# Patient Record
Sex: Female | Born: 1950 | Race: Black or African American | Hispanic: No | Marital: Single | Smoking: Former smoker
Health system: Southern US, Community
[De-identification: ages and names within clinical notes are randomized; demographics above are authoritative.]

## PROBLEM LIST (undated history)

## (undated) DIAGNOSIS — M19042 Primary osteoarthritis, left hand: Secondary | ICD-10-CM

## (undated) DIAGNOSIS — J301 Allergic rhinitis due to pollen: Secondary | ICD-10-CM

## (undated) DIAGNOSIS — F419 Anxiety disorder, unspecified: Secondary | ICD-10-CM

## (undated) DIAGNOSIS — I10 Essential (primary) hypertension: Secondary | ICD-10-CM

## (undated) DIAGNOSIS — G459 Transient cerebral ischemic attack, unspecified: Secondary | ICD-10-CM

## (undated) HISTORY — DX: Transient cerebral ischemic attack, unspecified: G45.9

## (undated) HISTORY — DX: Allergic rhinitis due to pollen: J30.1

## (undated) HISTORY — DX: Essential (primary) hypertension: I10

## (undated) HISTORY — DX: Anxiety disorder, unspecified: F41.9

## (undated) HISTORY — DX: Primary osteoarthritis, left hand: M19.042

---

## 1971-07-02 HISTORY — PX: APPENDECTOMY: SHX54

## 2001-07-22 ENCOUNTER — Emergency Department (HOSPITAL_COMMUNITY): Admission: EM | Admit: 2001-07-22 | Discharge: 2001-07-22 | Payer: Self-pay | Admitting: Emergency Medicine

## 2001-07-22 ENCOUNTER — Encounter: Payer: Self-pay | Admitting: Internal Medicine

## 2010-07-01 DIAGNOSIS — G459 Transient cerebral ischemic attack, unspecified: Secondary | ICD-10-CM

## 2010-07-01 HISTORY — DX: Transient cerebral ischemic attack, unspecified: G45.9

## 2011-06-30 ENCOUNTER — Emergency Department (HOSPITAL_BASED_OUTPATIENT_CLINIC_OR_DEPARTMENT_OTHER)
Admission: EM | Admit: 2011-06-30 | Discharge: 2011-06-30 | Disposition: A | Discharge status: Home / Self Care | Payer: Self-pay | Attending: Emergency Medicine | Admitting: Emergency Medicine

## 2011-06-30 DIAGNOSIS — J3489 Other specified disorders of nose and nasal sinuses: Secondary | ICD-10-CM | POA: Insufficient documentation

## 2011-06-30 DIAGNOSIS — J329 Chronic sinusitis, unspecified: Secondary | ICD-10-CM | POA: Insufficient documentation

## 2011-06-30 MED ORDER — DOXYCYCLINE HYCLATE 100 MG PO CAPS: 100.0000 mg | ORAL_CAPSULE | Freq: Two times a day (BID) | ORAL | Status: AC

## 2011-06-30 NOTE — ED Notes (Signed)
Pt reports she has the flu-exposure from grand child-c/o sinus congestion/fever since 12/25

## 2011-06-30 NOTE — ED Provider Notes (Signed)
History    This chart was scribed for Hilario Quarry, MD, MD by Smitty Pluck. The patient was seen in room MHCT2 and the patient's care was started at 3:21PM.   CSN: [id number]  Arrival date & time 06/30/11  1436   First MD Initiated Contact with Patient 06/30/11 1506      Chief Complaint  Patient presents with  . Influenza    (Consider location/radiation/quality/duration/timing/severity/associated sxs/prior treatment) The history is provided by the patient.   Carrie Ramsey is a 60 y.o. female who presents to the Emergency Department complaining of flu like symptoms onset 5 days ago. Pt denies vomiting, nausea. She reports nasal congestion and productive cough. Pt reports having sinus problems in the past. She reports having sick exposure from grandchild. She reports having fever of 103. Her symptoms have been constant since onset.  History reviewed. No pertinent past medical history.  History reviewed. No pertinent past surgical history.  No family history on file.  History  Substance Use Topics  . Smoking status: Former Games developer  . Smokeless tobacco: Not on file  . Alcohol Use: No    OB History    Grav Para Term Preterm Abortions TAB SAB Ect Mult Living                  Review of Systems  All other systems reviewed and are negative.  10 Systems reviewed and are negative for acute change except as noted in the HPI.   Allergies  Penicillins  Home Medications   Current Outpatient Rx  Name Route Sig Dispense Refill  . MULTIVITAMIN PO Oral Take by mouth.        BP 151/96  Pulse 93  Temp(Src) 98.3 F (36.8 C) (Oral)  Resp 20  Ht 5\' 6"  (1.676 m)  Wt 140 lb (63.504 kg)  BMI 22.60 kg/m2  SpO2 100%  Physical Exam  Nursing note and vitals reviewed. Constitutional: She is oriented to person, place, and time. She appears well-developed and well-nourished. No distress.  HENT:  Head: Normocephalic and atraumatic.  Right Ear: External ear normal.  Left Ear:  External ear normal.  Mouth/Throat: Oropharynx is clear and moist.       Tender in forehead  Eyes: Conjunctivae and EOM are normal. Pupils are equal, round, and reactive to light.  Neck: Normal range of motion. Neck supple.  Cardiovascular: Normal rate, regular rhythm and normal heart sounds.   Pulmonary/Chest: Effort normal and breath sounds normal. No respiratory distress.  Neurological: She is alert and oriented to person, place, and time.  Skin: Skin is warm and dry.  Psychiatric: She has a normal mood and affect. Her behavior is normal.    ED Course  Procedures (including critical care time) DIAGNOSTIC STUDIES: Oxygen Saturation is 100% on room air, normal by my interpretation.    COORDINATION OF CARE:    Labs Reviewed - No data to display No results found.   No diagnosis found.    MDM       I personally performed the services described in this documentation, which was scribed in my presence. The recorded information has been reviewed and considered.     Hilario Quarry, MD 06/30/11 [phone]

## 2011-08-26 ENCOUNTER — Ambulatory Visit (INDEPENDENT_AMBULATORY_CARE_PROVIDER_SITE_OTHER): Payer: Self-pay | Admitting: Family Medicine

## 2011-08-26 ENCOUNTER — Encounter: Payer: Self-pay | Admitting: Family Medicine

## 2011-08-26 VITALS — BP 169/81 | HR 60 | Ht 66.0 in | Wt 139.0 lb

## 2011-08-26 DIAGNOSIS — I10 Essential (primary) hypertension: Secondary | ICD-10-CM

## 2011-08-26 DIAGNOSIS — F329 Major depressive disorder, single episode, unspecified: Secondary | ICD-10-CM

## 2011-08-26 DIAGNOSIS — M19042 Primary osteoarthritis, left hand: Secondary | ICD-10-CM

## 2011-08-26 DIAGNOSIS — F411 Generalized anxiety disorder: Secondary | ICD-10-CM

## 2011-08-26 DIAGNOSIS — M19049 Primary osteoarthritis, unspecified hand: Secondary | ICD-10-CM

## 2011-08-26 DIAGNOSIS — J301 Allergic rhinitis due to pollen: Secondary | ICD-10-CM

## 2011-08-26 DIAGNOSIS — F419 Anxiety disorder, unspecified: Secondary | ICD-10-CM

## 2011-08-26 DIAGNOSIS — J309 Allergic rhinitis, unspecified: Secondary | ICD-10-CM

## 2011-08-26 DIAGNOSIS — R259 Unspecified abnormal involuntary movements: Secondary | ICD-10-CM

## 2011-08-26 DIAGNOSIS — R251 Tremor, unspecified: Secondary | ICD-10-CM

## 2011-08-26 LAB — CBC WITH DIFFERENTIAL/PLATELET
Basophils Absolute: 0 10*3/uL (ref 0.0–0.1)
Basophils Relative: 1 % (ref 0–1)
Eosinophils Absolute: 0.1 10*3/uL (ref 0.0–0.7)
Eosinophils Relative: 2 % (ref 0–5)
HCT: 45 % (ref 36.0–46.0)
Hemoglobin: 14.9 g/dL (ref 12.0–15.0)
Lymphocytes Relative: 30 % (ref 12–46)
Lymphs Abs: 1.4 10*3/uL (ref 0.7–4.0)
MCH: 31.4 pg (ref 26.0–34.0)
MCHC: 33.1 g/dL (ref 30.0–36.0)
MCV: 94.7 fL (ref 78.0–100.0)
Monocytes Absolute: 0.4 10*3/uL (ref 0.1–1.0)
Monocytes Relative: 8 % (ref 3–12)
Neutro Abs: 2.9 10*3/uL (ref 1.7–7.7)
Neutrophils Relative %: 60 % (ref 43–77)
Platelets: 99 10*3/uL — ABNORMAL LOW (ref 150–400)
RBC: 4.75 MIL/uL (ref 3.87–5.11)
RDW: 14.3 % (ref 11.5–15.5)
WBC: 4.8 10*3/uL (ref 4.0–10.5)

## 2011-08-26 LAB — TSH: TSH: 1.897 u[IU]/mL (ref 0.350–4.500)

## 2011-08-26 NOTE — Patient Instructions (Addendum)
Dear Mrs. Novell,   Thank you for coming to clinic today. Please read below regarding the issues that we discussed.   1. Tremor - This could be from multiple origins. We should continue with the beta blocker for this time to see if it improves. We will reassess in 2 weeks.    2. Depression and Anxiety - I will send you home with 2 forms to complete and bring back to the next visit.   Please follow up in clinic in 2 weeks . Please call earlier if you have any questions or concerns.   Sincerely,   Dr. Clinton Sawyer

## 2011-08-27 ENCOUNTER — Encounter: Payer: Self-pay | Admitting: Family Medicine

## 2011-08-27 DIAGNOSIS — M19042 Primary osteoarthritis, left hand: Secondary | ICD-10-CM | POA: Insufficient documentation

## 2011-08-27 DIAGNOSIS — J301 Allergic rhinitis due to pollen: Secondary | ICD-10-CM | POA: Insufficient documentation

## 2011-08-27 DIAGNOSIS — R251 Tremor, unspecified: Secondary | ICD-10-CM | POA: Insufficient documentation

## 2011-08-27 DIAGNOSIS — F419 Anxiety disorder, unspecified: Secondary | ICD-10-CM | POA: Insufficient documentation

## 2011-08-27 MED ORDER — METOPROLOL TARTRATE 25 MG PO TABS: 25.0000 mg | ORAL_TABLET | Freq: Two times a day (BID) | ORAL | Status: DC

## 2011-08-27 MED ORDER — GLUCOSAMINE 500 MG PO CAPS: 500.0000 mg | ORAL_CAPSULE | Freq: Every day | ORAL | Status: DC

## 2011-08-27 MED ORDER — VITAMIN C 500 MG PO CAPS: 500.0000 mg | ORAL_CAPSULE | Freq: Every day | ORAL | Status: AC

## 2011-08-27 MED ORDER — VITAMIN B-12 100 MCG PO TABS: 500.0000 ug | ORAL_TABLET | Freq: Every day | ORAL | Status: AC

## 2011-08-27 NOTE — Assessment & Plan Note (Signed)
This is not the clear origin of her tremor, but is significantly impacting her life.  Carrie Ramsey will complete a GAD questionnaire and weill discuss at the next visit.

## 2011-09-02 DIAGNOSIS — F329 Major depressive disorder, single episode, unspecified: Secondary | ICD-10-CM | POA: Insufficient documentation

## 2011-09-02 DIAGNOSIS — I10 Essential (primary) hypertension: Secondary | ICD-10-CM | POA: Insufficient documentation

## 2011-09-02 NOTE — Progress Notes (Signed)
  Subjective:    Patient ID: Carrie Ramsey, female    DOB: 1951-03-07, 61 y.o.   MRN: [id number]  HPI Mrs. Lal is a new patient who presents with a chief complaint of a tremor.   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; since that time the tremor has been persistent; she states that is will occasionally abate when she is working and not thinking about it, but returns immediately after she realizes that it is not present; also reduces when she is cooking or lying down to go to sleep; only present in her left hand; she has not noticed any other symptoms such as difficulty with walking, speech, weakness, or tremors elsewhere; last week she briefly saw her former PCP who she had not been too in years 2/2 to having lots of financial issues; she was prescribed metoprolol BID by this PCP, which she took for the first time today; this tremor causes significant stress to her and she 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; she is not on any treatment for anxiety or depression, but feels that both are much improved    Review of Systems Positive: left hand tremor, mild depression, situational anxiety, allergic rhinitis, joint pain in left hand Negative: difficulty walking, poor balance, right-sided tremor, difficulty swallowing, difficulty speaking,      Objective:   Physical Exam BP 169/81  Pulse 60  Ht 5\' 6"  (1.676 m)  Wt 139 lb (63.05 kg)  BMI 22.44 kg/m2 Vitals - patient is hypertensive Gen: alert, oriented, distressed appearing Neuro: resting tremor of left thumb at moderate frequency, hand and digits 2-5 flexed at MCP, PIP, DIP joints; mild decrease in tremor with action and postural  movement but difficulty of opening door with left hand; mild rigidity of left upper extremity with passive flexion; no rigidity of RUE;  normal gait; no bradykinesia; equivocal postural instability testing  MSK:5/5 grip strength bilaterally; 5/5 strength of upper extermities Psych: distressed, tearful throughout interview when discussing financial struggles, states that mood much improved from last year     Assessment & Plan:  Mrs. Deol is a new patient who presents with a tremor of unclear etiology as well as anxiety, depression, and hypertesion which all require further monitoring.

## 2011-09-02 NOTE — Assessment & Plan Note (Signed)
Unclear etiology despite patient's claim that is it related to stress. She is likely correct as it does have components of rest, postural and intention. 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  The differential also includes parkinson's disease and vascular-related (pt did state she had facial droop for < 48 hours last year but she did not see a physician).  - Continue metoprolol since this is her first day of the full course and would treat essential tremor, may lower BP, and could decrease situational anxiety; However, metoprolol doesn't have as good of data as propranolol for tremor, so consider switch if indicated  - Further PE testing needed included cerebellar function tests - Consider imaging of head and referral to neurology

## 2011-09-02 NOTE — Assessment & Plan Note (Signed)
This has not been addressed in the past. However, the patient states that it is much improved as she wouldn't get out of bed for days last year.  - Patient will complete PHQ-9 and will be addressed at next visit

## 2011-09-16 ENCOUNTER — Encounter: Payer: Self-pay | Admitting: Family Medicine

## 2011-09-16 ENCOUNTER — Ambulatory Visit (INDEPENDENT_AMBULATORY_CARE_PROVIDER_SITE_OTHER): Payer: Self-pay | Admitting: Family Medicine

## 2011-09-16 VITALS — BP 138/84 | HR 78 | Temp 98.7°F | Ht 66.0 in | Wt 144.5 lb

## 2011-09-16 DIAGNOSIS — F411 Generalized anxiety disorder: Secondary | ICD-10-CM

## 2011-09-16 DIAGNOSIS — F419 Anxiety disorder, unspecified: Secondary | ICD-10-CM

## 2011-09-16 DIAGNOSIS — R251 Tremor, unspecified: Secondary | ICD-10-CM

## 2011-09-16 DIAGNOSIS — R259 Unspecified abnormal involuntary movements: Secondary | ICD-10-CM

## 2011-09-16 DIAGNOSIS — I10 Essential (primary) hypertension: Secondary | ICD-10-CM

## 2011-09-16 DIAGNOSIS — F329 Major depressive disorder, single episode, unspecified: Secondary | ICD-10-CM

## 2011-09-16 NOTE — Progress Notes (Signed)
  Subjective:    Patient ID: Carrie Ramsey, female    DOB: August 01, 1950, 61 y.o.   MRN: [id number]  HPI Patient here for follow-up of tremor and anxiety.   Left Hand Tremor - Has been present intermittently since last visit. It comes at rest and is in her left hand. She has not noticed any improvement with taking metoprolol, but has still been taking the metoprolol daily because it reduces her headache.  She still believes that it is most heavily correlated to emotion. At time she awake with shaking. She denies difficulty writing (altought she write with her right hand) or walking, but has little use of the left hand.   Anxiety - Not improved, still focused on financial problems more than health issues, does not like having to be referred for tests of other physicians b/c this causes her more stress financially which worsens her health; causes poor sleeping and exacerbates her tremor; She did complete GAD and PHQ questionnaires and dropped them off with office staff, but these have been misplaced; patient is amenable to pharmacotherapy today to help reduce the impact of stress on her daily life; She notes being optimistic about financial future   Review of Systems Positive: for headache (daily, occipital), anxiety regarding financial situation, dizziness Negative: CP, SOB, syncope, suicidal ideation    Objective:   Physical Exam Gen: talkative,  Mildly distressed, masked facies Neuro: resting tremor of of left thumb and left foot while sitting on the exam table; present with action, mildly reduced with holding LUE static; significant rigidity of LUE vs. 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 Hussey is a 61 year old female with uncontrolled anxiety that could be exacerbating a tremor that has many Parkinsonian features. Therefore, she would benefit from further evaluation by a neurologist.

## 2011-09-17 MED ORDER — SERTRALINE HCL 50 MG PO TABS: 50.0000 mg | ORAL_TABLET | Freq: Every day | ORAL | Status: AC

## 2011-09-30 ENCOUNTER — Telehealth: Payer: Self-pay | Admitting: *Deleted

## 2011-09-30 NOTE — Telephone Encounter (Signed)
Message left on our office voicemail to call back to [facility] (Diane ---ext. [phone]).  Returned call and left message to call us back.  Gaylene Brooks, RN

## 2011-10-01 NOTE — Telephone Encounter (Signed)
Forward message to red team for referral to neurology clinic at [facility].  Gaylene Brooks, RN

## 2011-10-01 NOTE — Telephone Encounter (Signed)
Message left on our office voicemail from Diane at [facility].  "Patient does not have any insurance or money" and patient stated she will call back to schedule an appt later.  FYI---will need new referral if patient calls back after 3 months.   Will route to Dr. Clinton Sawyer.   Gaylene Brooks, RN

## 2011-10-01 NOTE — Telephone Encounter (Signed)
The patient stated that she would like to be seen in the resident neurology clinic at [facility] if possible. Se believes that this will be more affordable. Have you tried to refer her there?  Thank you for all of your help.

## 2011-10-06 NOTE — Assessment & Plan Note (Signed)
This tremor has many characteristic features of Parkinsonism including resting, unilateral, in foot as well, rigidity, bradykinesia and masked facies. I would like for her to be evaluated by a neurologist. She would prefer to be seen in the resident neurology clinic at [facility], because she believes that it will be less expensive or that she may get some financial assistance. Otherwise, if she refuses or cannot be seen by a neurologist, then I will consider starting a first line medication for parkinsonism and obtaining further imaging.

## 2011-10-06 NOTE — Assessment & Plan Note (Signed)
GAD questionnaire never found. Regardless, patient will start SSRI. Reassess in 6-8 weeks.

## 2011-10-06 NOTE — Assessment & Plan Note (Signed)
PHQ-9 not found. Regardless, the patient notes major depression and anxiety that would probably benefit from SSRI treatment. Start Sertraline at 50 mg daily. Reassess in 6-8 weeks and titrate thereafter.

## 2011-10-06 NOTE — Assessment & Plan Note (Signed)
BP Readings from Last 3 Encounters:  09/16/11 138/84  08/26/11 169/81  06/30/11 151/96    BP greatly improved on Metoprolol BID. Patient has also noticed decrease in frequency of headache, which could have been related to HTN. Continue Metoprolol.

## 2011-10-23 ENCOUNTER — Ambulatory Visit: Payer: Self-pay | Admitting: Family Medicine

## 2011-10-29 NOTE — Telephone Encounter (Signed)
Per note in referral scheduling by Gaylyn Lambert, CMA---Pt will be seen at [facility] in their resident teaching program. Pt advised they will send a NP packet with all info on where to go, etc. Pt agreed to setting up a payment plan w/them.  Appt on 12/05/11 @ 1:45pm.  Gaylene Brooks, RN

## 2012-08-10 ENCOUNTER — Encounter: Payer: Self-pay | Admitting: Internal Medicine

## 2014-11-11 ENCOUNTER — Emergency Department (HOSPITAL_COMMUNITY)
Admission: EM | Admit: 2014-11-11 | Discharge: 2014-11-11 | Disposition: A | Discharge status: Home / Self Care | Payer: 59 | Attending: Emergency Medicine | Admitting: Emergency Medicine

## 2014-11-11 ENCOUNTER — Encounter (HOSPITAL_COMMUNITY): Payer: Self-pay | Admitting: Emergency Medicine

## 2014-11-11 DIAGNOSIS — Z880 Allergy status to penicillin: Secondary | ICD-10-CM | POA: Insufficient documentation

## 2014-11-11 DIAGNOSIS — Z87891 Personal history of nicotine dependence: Secondary | ICD-10-CM | POA: Insufficient documentation

## 2014-11-11 DIAGNOSIS — Z8659 Personal history of other mental and behavioral disorders: Secondary | ICD-10-CM | POA: Insufficient documentation

## 2014-11-11 DIAGNOSIS — Z8709 Personal history of other diseases of the respiratory system: Secondary | ICD-10-CM | POA: Diagnosis not present

## 2014-11-11 DIAGNOSIS — M1990 Unspecified osteoarthritis, unspecified site: Secondary | ICD-10-CM | POA: Insufficient documentation

## 2014-11-11 DIAGNOSIS — Z79899 Other long term (current) drug therapy: Secondary | ICD-10-CM | POA: Insufficient documentation

## 2014-11-11 DIAGNOSIS — Z8673 Personal history of transient ischemic attack (TIA), and cerebral infarction without residual deficits: Secondary | ICD-10-CM | POA: Diagnosis not present

## 2014-11-11 DIAGNOSIS — R51 Headache: Secondary | ICD-10-CM | POA: Diagnosis not present

## 2014-11-11 DIAGNOSIS — I10 Essential (primary) hypertension: Secondary | ICD-10-CM | POA: Insufficient documentation

## 2014-11-11 DIAGNOSIS — G20 Parkinson's disease: Secondary | ICD-10-CM | POA: Insufficient documentation

## 2014-11-11 DIAGNOSIS — R519 Headache, unspecified: Secondary | ICD-10-CM

## 2014-11-11 MED ORDER — IBUPROFEN 200 MG PO TABS
600.0000 mg | ORAL_TABLET | Freq: Once | ORAL | Status: AC
Start: 2014-11-11 — End: 2014-11-11
  Administered 2014-11-11: 600 mg via ORAL
  Filled 2014-11-11: qty 3

## 2014-11-11 MED ORDER — IBUPROFEN 600 MG PO TABS: 600.0000 mg | ORAL_TABLET | Freq: Four times a day (QID) | ORAL | Status: DC | PRN

## 2014-11-11 NOTE — ED Provider Notes (Signed)
CSN: [id number]     Arrival date & time 11/11/14  1302 History   First MD Initiated Contact with Patient 11/11/14 1505     Chief Complaint  Patient presents with  . Head Pain      (Consider location/radiation/quality/duration/timing/severity/associated sxs/prior Treatment) HPI Comments: Pt comes in with cc of headache. Pt has hx of early parkinsons, HTN, HL. Reports that she started having pain to the right parieto-occiptal region, very focal starting yday. Pain is throbbing and burning type. Patient has NO HISTORY OF MIGRAINES, and has no hx of headaches, but she reports similar headache a year ago, she went to [facility] and received tylenol. Pt denies any n/v/f/c/neck pain/stiffness/numbness/tingling/vision complains/gait issues. She has no hx of strokes. Pt reports taking tylenol last night, and got some relief and was able to sleep. She woke up with mild pain, which has gotten worse again. Pain is not the worst pain in her life. Pain is not positional.    ROS 10 Systems reviewed and are negative for acute change except as noted in the HPI.     The history is provided by the patient.    Past Medical History  Diagnosis Date  . TIA (transient ischemic attack) 2012    Patient reported, did not seek physician help  . Hay fever   . Arthritis of left hand   . Anxiety   . Hypertension    Past Surgical History  Procedure Laterality Date  . Appendectomy  1973   Family History  Problem Relation Age of Onset  . Aneurysm Brother   . Diabetes Mother    History  Substance Use Topics  . Smoking status: Former Research scientist (life sciences)  . Smokeless tobacco: Not on file  . Alcohol Use: No   OB History    No data available     Review of Systems  Neurological: Positive for headaches. Negative for dizziness, syncope, facial asymmetry, speech difficulty, weakness and numbness.  All other systems reviewed and are negative.     Allergies  Penicillins and Sulfa antibiotics  Home Medications    Prior to Admission medications   Medication Sig Start Date End Date Taking? Authorizing Provider  Ascorbic Acid (VITAMIN C) 500 MG CAPS Take 500 mg by mouth daily. 08/27/11  Yes Angelica Ran, MD  Carbidopa-Levodopa ER (SINEMET CR) 25-100 MG tablet controlled release Take 1.5 tablets by mouth 3 (three) times daily.  10/03/14  Yes Historical Provider, MD  Multiple Vitamins-Minerals (MULTIVITAMIN PO) Take 1 tablet by mouth daily.    Yes Historical Provider, MD  omega-3 acid ethyl esters (LOVAZA) 1 G capsule Take by mouth 2 (two) times daily.   Yes Historical Provider, MD  Glucosamine 500 MG CAPS Take 1 capsule (500 mg total) by mouth daily. Patient not taking: Reported on 11/11/2014 08/27/11   Angelica Ran, MD  ibuprofen (ADVIL,MOTRIN) 600 MG tablet Take 1 tablet (600 mg total) by mouth every 6 (six) hours as needed. 11/11/14   Varney Biles, MD  ibuprofen (ADVIL,MOTRIN) 600 MG tablet Take 1 tablet (600 mg total) by mouth every 6 (six) hours as needed. 11/11/14   Varney Biles, MD  metoprolol tartrate (LOPRESSOR) 25 MG tablet Take 1 tablet (25 mg total) by mouth 2 (two) times daily. Patient not taking: Reported on 11/11/2014 08/27/11 08/26/12  Angelica Ran, MD   BP 169/92 mmHg  Pulse 72  Temp(Src) 97.8 F (36.6 C) (Oral)  Resp 18  SpO2 99% Physical Exam  Constitutional: She is oriented to person, place,  and time. She appears well-developed and well-nourished.  HENT:  Head: Normocephalic and atraumatic.  Right Ear: External ear normal.  Left Ear: External ear normal.  No mastoid tenderness  Eyes: EOM are normal. Pupils are equal, round, and reactive to light.  Neck: Neck supple.  No nuchal rigidity  Cardiovascular: Normal rate, regular rhythm and normal heart sounds.   No murmur heard. Pulmonary/Chest: Effort normal. No respiratory distress.  Abdominal: Soft. She exhibits no distension. There is no tenderness. There is no rebound and no guarding.  Neurological: She is  alert and oriented to person, place, and time. No cranial nerve deficit. Coordination normal.  Skin: Skin is warm and dry.  Nursing note and vitals reviewed.   ED Course  Procedures (including critical care time) Labs Review Labs Reviewed - No data to display  Imaging Review No results found.   EKG Interpretation None      MDM   Final diagnoses:  Acute intractable headache, unspecified headache type    Pt comes in with cc of headaches.  DDX includes: Primary headaches - including migrainous headaches, cluster headaches, tension headaches. ICH Carotid dissection Cavernous sinus thrombosis Meningitis Sinusitis Tumor Vascular headaches AV malformation Brain aneurysm Muscular headaches  A/P: Pt comes in with cc of headaches. No concerns for life threatening secondary headaches because  Pt has no migraine hx. No n/v/f/c, no meningeal signs. Pain is very focal - one specific spot in the parieto-temporal region. No trauma, no neuro complains or neuro deficits.  Discussed case with the patient and the son. We agreed to treat the pain conservatively for now. She is to see her pcp if the pain persists. I cant think of the specific cause for the headache (? Tension type headache), but there appears no evidence of brain bleed or infection.    Varney Biles, MD 11/11/14 [phone]

## 2014-11-11 NOTE — Discharge Instructions (Signed)
We saw you in the ER for headaches. We are not sure what is causing your headaches, however, there appears to be no evidence of infection, bleeds or tumors based on our exam and results.  Please take motrin round the clock for the next 6 hours, and take other meds prescribed only for break through pain. See your doctor if the pain persists, as you might need better medications or a specialist.   Headaches, Frequently Asked Questions MIGRAINE HEADACHES Q: What is migraine? What causes it? How can I treat it? A: Generally, migraine headaches begin as a dull ache. Then they develop into a constant, throbbing, and pulsating pain. You may experience pain at the temples. You may experience pain at the front or back of one or both sides of the head. The pain is usually accompanied by a combination of:  Nausea.  Vomiting.  Sensitivity to light and noise. Some people (about 15%) experience an aura (see below) before an attack. The cause of migraine is believed to be chemical reactions in the brain. Treatment for migraine may include over-the-counter or prescription medications. It may also include self-help techniques. These include relaxation training and biofeedback.  Q: What is an aura? A: About 15% of people with migraine get an "aura". This is a sign of neurological symptoms that occur before a migraine headache. You may see wavy or jagged lines, dots, or flashing lights. You might experience tunnel vision or blind spots in one or both eyes. The aura can include visual or auditory hallucinations (something imagined). It may include disruptions in smell (such as strange odors), taste or touch. Other symptoms include:  Numbness.  A "pins and needles" sensation.  Difficulty in recalling or speaking the correct word. These neurological events may last as long as 60 minutes. These symptoms will fade as the headache begins. Q: What is a trigger? A: Certain physical or environmental factors can lead  to or "trigger" a migraine. These include:  Foods.  Hormonal changes.  Weather.  Stress. It is important to remember that triggers are different for everyone. To help prevent migraine attacks, you need to figure out which triggers affect you. Keep a headache diary. This is a good way to track triggers. The diary will help you talk to your healthcare professional about your condition. Q: Does weather affect migraines? A: Bright sunshine, hot, humid conditions, and drastic changes in barometric pressure may lead to, or "trigger," a migraine attack in some people. But studies have shown that weather does not act as a trigger for everyone with migraines. Q: What is the link between migraine and hormones? A: Hormones start and regulate many of your body's functions. Hormones keep your body in balance within a constantly changing environment. The levels of hormones in your body are unbalanced at times. Examples are during menstruation, pregnancy, or menopause. That can lead to a migraine attack. In fact, about three quarters of all women with migraine report that their attacks are related to the menstrual cycle.  Q: Is there an increased risk of stroke for migraine sufferers? A: The likelihood of a migraine attack causing a stroke is very remote. That is not to say that migraine sufferers cannot have a stroke associated with their migraines. In persons under age 101, the most common associated factor for stroke is migraine headache. But over the course of a person's normal life span, the occurrence of migraine headache may actually be associated with a reduced risk of dying from cerebrovascular disease due to  stroke.  Q: What are acute medications for migraine? A: Acute medications are used to treat the pain of the headache after it has started. Examples over-the-counter medications, NSAIDs, ergots, and triptans.  Q: What are the triptans? A: Triptans are the newest class of abortive medications. They  are specifically targeted to treat migraine. Triptans are vasoconstrictors. They moderate some chemical reactions in the brain. The triptans work on receptors in your brain. Triptans help to restore the balance of a neurotransmitter called serotonin. Fluctuations in levels of serotonin are thought to be a main cause of migraine.  Q: Are over-the-counter medications for migraine effective? A: Over-the-counter, or "OTC," medications may be effective in relieving mild to moderate pain and associated symptoms of migraine. But you should see your caregiver before beginning any treatment regimen for migraine.  Q: What are preventive medications for migraine? A: Preventive medications for migraine are sometimes referred to as "prophylactic" treatments. They are used to reduce the frequency, severity, and length of migraine attacks. Examples of preventive medications include antiepileptic medications, antidepressants, beta-blockers, calcium channel blockers, and NSAIDs (nonsteroidal anti-inflammatory drugs). Q: Why are anticonvulsants used to treat migraine? A: During the past few years, there has been an increased interest in antiepileptic drugs for the prevention of migraine. They are sometimes referred to as "anticonvulsants". Both epilepsy and migraine may be caused by similar reactions in the brain.  Q: Why are antidepressants used to treat migraine? A: Antidepressants are typically used to treat people with depression. They may reduce migraine frequency by regulating chemical levels, such as serotonin, in the brain.  Q: What alternative therapies are used to treat migraine? A: The term "alternative therapies" is often used to describe treatments considered outside the scope of conventional Western medicine. Examples of alternative therapy include acupuncture, acupressure, and yoga. Another common alternative treatment is herbal therapy. Some herbs are believed to relieve headache pain. Always discuss  alternative therapies with your caregiver before proceeding. Some herbal products contain arsenic and other toxins. TENSION HEADACHES Q: What is a tension-type headache? What causes it? How can I treat it? A: Tension-type headaches occur randomly. They are often the result of temporary stress, anxiety, fatigue, or anger. Symptoms include soreness in your temples, a tightening band-like sensation around your head (a "vice-like" ache). Symptoms can also include a pulling feeling, pressure sensations, and contracting head and neck muscles. The headache begins in your forehead, temples, or the back of your head and neck. Treatment for tension-type headache may include over-the-counter or prescription medications. Treatment may also include self-help techniques such as relaxation training and biofeedback. CLUSTER HEADACHES Q: What is a cluster headache? What causes it? How can I treat it? A: Cluster headache gets its name because the attacks come in groups. The pain arrives with little, if any, warning. It is usually on one side of the head. A tearing or bloodshot eye and a runny nose on the same side of the headache may also accompany the pain. Cluster headaches are believed to be caused by chemical reactions in the brain. They have been described as the most severe and intense of any headache type. Treatment for cluster headache includes prescription medication and oxygen. SINUS HEADACHES Q: What is a sinus headache? What causes it? How can I treat it? A: When a cavity in the bones of the face and skull (a sinus) becomes inflamed, the inflammation will cause localized pain. This condition is usually the result of an allergic reaction, a tumor, or an infection. If your  headache is caused by a sinus blockage, such as an infection, you will probably have a fever. An x-ray will confirm a sinus blockage. Your caregiver's treatment might include antibiotics for the infection, as well as antihistamines or  decongestants.  REBOUND HEADACHES Q: What is a rebound headache? What causes it? How can I treat it? A: A pattern of taking acute headache medications too often can lead to a condition known as "rebound headache." A pattern of taking too much headache medication includes taking it more than 2 days per week or in excessive amounts. That means more than the label or a caregiver advises. With rebound headaches, your medications not only stop relieving pain, they actually begin to cause headaches. Doctors treat rebound headache by tapering the medication that is being overused. Sometimes your caregiver will gradually substitute a different type of treatment or medication. Stopping may be a challenge. Regularly overusing a medication increases the potential for serious side effects. Consult a caregiver if you regularly use headache medications more than 2 days per week or more than the label advises. ADDITIONAL QUESTIONS AND ANSWERS Q: What is biofeedback? A: Biofeedback is a self-help treatment. Biofeedback uses special equipment to monitor your body's involuntary physical responses. Biofeedback monitors:  Breathing.  Pulse.  Heart rate.  Temperature.  Muscle tension.  Brain activity. Biofeedback helps you refine and perfect your relaxation exercises. You learn to control the physical responses that are related to stress. Once the technique has been mastered, you do not need the equipment any more. Q: Are headaches hereditary? A: Four out of five (80%) of people that suffer report a family history of migraine. Scientists are not sure if this is genetic or a family predisposition. Despite the uncertainty, a child has a 50% chance of having migraine if one parent suffers. The child has a 75% chance if both parents suffer.  Q: Can children get headaches? A: By the time they reach high school, most young people have experienced some type of headache. Many safe and effective approaches or medications  can prevent a headache from occurring or stop it after it has begun.  Q: What type of doctor should I see to diagnose and treat my headache? A: Start with your primary caregiver. Discuss his or her experience and approach to headaches. Discuss methods of classification, diagnosis, and treatment. Your caregiver may decide to recommend you to a headache specialist, depending upon your symptoms or other physical conditions. Having diabetes, allergies, etc., may require a more comprehensive and inclusive approach to your headache. The [employer] will provide, upon request, a list of [facility] physician members in your state. Document Released: 09/07/2003 Document Revised: 09/09/2011 Document Reviewed: 02/15/2008 [facility] Patient Information 2015 [place], [place]. This information is not intended to replace advice given to you by your health care provider. Make sure you discuss any questions you have with your health care provider. Tension Headache A tension headache is a feeling of pain, pressure, or aching often felt over the front and sides of the head. The pain can be dull or can feel tight (constricting). It is the most common type of headache. Tension headaches are not normally associated with nausea or vomiting and do not get worse with physical activity. Tension headaches can last 30 minutes to several days.  CAUSES  The exact cause is not known, but it may be caused by chemicals and hormones in the brain that lead to pain. Tension headaches often begin after stress, anxiety, or depression. Other triggers  may include: Alcohol. Caffeine (too much or withdrawal). Respiratory infections (colds, flu, sinus infections). Dental problems or teeth clenching. Fatigue. Holding your head and neck in one position too long while using a computer. SYMPTOMS  Pressure around the head.  Dull, aching head pain.  Pain felt over the front and sides of the head.  Tenderness in the muscles of the head,  neck, and shoulders. DIAGNOSIS  A tension headache is often diagnosed based on:  Symptoms.  Physical examination.  A CT scan or MRI of your head. These tests may be ordered if symptoms are severe or unusual. TREATMENT  Medicines may be given to help relieve symptoms.  HOME CARE INSTRUCTIONS  Only take over-the-counter or prescription medicines for pain or discomfort as directed by your caregiver.  Lie down in a dark, quiet room when you have a headache.  Keep a journal to find out what may be triggering your headaches. For example, write down: What you eat and drink. How much sleep you get. Any change to your diet or medicines. Try massage or other relaxation techniques.  Ice packs or heat applied to the head and neck can be used. Use these 3 to 4 times per day for 15 to 20 minutes each time, or as needed.  Limit stress.  Sit up straight, and do not tense your muscles.  Quit smoking if you smoke. Limit alcohol use. Decrease the amount of caffeine you drink, or stop drinking caffeine. Eat and exercise regularly. Get 7 to 9 hours of sleep, or as recommended by your caregiver. Avoid excessive use of pain medicine as recurrent headaches can occur.  SEEK MEDICAL CARE IF:  You have problems with the medicines you were prescribed. Your medicines do not work. You have a change from the usual headache. You have nausea or vomiting. SEEK IMMEDIATE MEDICAL CARE IF:  Your headache becomes severe. You have a fever. You have a stiff neck. You have loss of vision. You have muscular weakness or loss of muscle control. You lose your balance or have trouble walking. You feel faint or pass out. You have severe symptoms that are different from your first symptoms. MAKE SURE YOU:  Understand these instructions. Will watch your condition. Will get help right away if you are not doing well or get worse. Document Released: 06/17/2005 Document Revised: 09/09/2011 Document Reviewed:  06/07/2011 [facility] Patient Information 2015 [place], [place]. This information is not intended to replace advice given to you by your health care provider. Make sure you discuss any questions you have with your health care provider.

## 2014-11-11 NOTE — ED Notes (Addendum)
Patient reports migraine starting yesterday. Has been taking extra strength tylenol with no alleviation of pain. Denies blurry vision. Endorses light sensitivity. Denies unilateral numbness/weakness. No other s/s. Not on blood thinners. Denies hx migraines.

## 2014-11-11 NOTE — ED Notes (Signed)
Pt reports tenderness to back of her head, denies pain anywhere else, area is sensitive to touch and if she tries to lay down and puts pressure on area pain is increased, no redness noted to area, denies injury or hitting head

## 2015-10-28 ENCOUNTER — Encounter (HOSPITAL_COMMUNITY): Payer: Self-pay

## 2015-10-28 ENCOUNTER — Emergency Department (HOSPITAL_COMMUNITY)
Admission: EM | Admit: 2015-10-28 | Discharge: 2015-10-28 | Disposition: A | Discharge status: Home / Self Care | Payer: Medicare Other | Attending: Emergency Medicine | Admitting: Emergency Medicine

## 2015-10-28 ENCOUNTER — Emergency Department (HOSPITAL_COMMUNITY): Payer: Medicare Other

## 2015-10-28 DIAGNOSIS — I10 Essential (primary) hypertension: Secondary | ICD-10-CM | POA: Diagnosis not present

## 2015-10-28 DIAGNOSIS — Z8673 Personal history of transient ischemic attack (TIA), and cerebral infarction without residual deficits: Secondary | ICD-10-CM | POA: Diagnosis not present

## 2015-10-28 DIAGNOSIS — Z79899 Other long term (current) drug therapy: Secondary | ICD-10-CM | POA: Insufficient documentation

## 2015-10-28 DIAGNOSIS — Z87891 Personal history of nicotine dependence: Secondary | ICD-10-CM | POA: Diagnosis not present

## 2015-10-28 DIAGNOSIS — R51 Headache: Secondary | ICD-10-CM | POA: Diagnosis present

## 2015-10-28 DIAGNOSIS — Z791 Long term (current) use of non-steroidal anti-inflammatories (NSAID): Secondary | ICD-10-CM | POA: Insufficient documentation

## 2015-10-28 DIAGNOSIS — M19042 Primary osteoarthritis, left hand: Secondary | ICD-10-CM | POA: Insufficient documentation

## 2015-10-28 DIAGNOSIS — Z7982 Long term (current) use of aspirin: Secondary | ICD-10-CM | POA: Insufficient documentation

## 2015-10-28 DIAGNOSIS — R519 Headache, unspecified: Secondary | ICD-10-CM

## 2015-10-28 MED ORDER — DIAZEPAM 5 MG/ML IJ SOLN
5.0000 mg | Freq: Once | INTRAMUSCULAR | Status: AC
  Administered 2015-10-28: 5 mg via INTRAVENOUS
  Filled 2015-10-28: qty 2

## 2015-10-28 MED ORDER — HYDROMORPHONE HCL 1 MG/ML IJ SOLN
0.5000 mg | Freq: Once | INTRAMUSCULAR | Status: AC
  Administered 2015-10-28: 0.5 mg via INTRAVENOUS
  Filled 2015-10-28: qty 1

## 2015-10-28 MED ORDER — METHOCARBAMOL 500 MG PO TABS: 500.0000 mg | ORAL_TABLET | Freq: Two times a day (BID) | ORAL | Status: AC

## 2015-10-28 MED ORDER — METOCLOPRAMIDE HCL 5 MG/ML IJ SOLN
10.0000 mg | Freq: Once | INTRAMUSCULAR | Status: AC
  Administered 2015-10-28: 10 mg via INTRAVENOUS
  Filled 2015-10-28: qty 2

## 2015-10-28 MED ORDER — SODIUM CHLORIDE 0.9 % IV BOLUS (SEPSIS)
1000.0000 mL | Freq: Once | INTRAVENOUS | Status: AC
  Administered 2015-10-28: 1000 mL via INTRAVENOUS

## 2015-10-28 MED ORDER — KETOROLAC TROMETHAMINE 30 MG/ML IJ SOLN
30.0000 mg | Freq: Once | INTRAMUSCULAR | Status: AC
  Administered 2015-10-28: 30 mg via INTRAVENOUS
  Filled 2015-10-28: qty 1

## 2015-10-28 MED ORDER — DIPHENHYDRAMINE HCL 50 MG/ML IJ SOLN
25.0000 mg | Freq: Once | INTRAMUSCULAR | Status: AC
  Administered 2015-10-28: 25 mg via INTRAVENOUS
  Filled 2015-10-28: qty 1

## 2015-10-28 NOTE — ED Notes (Signed)
She c/o occipital area h/a x 1 week.  She states at times, she can feel a "bump" there.  She has been seen for this in the past without a firm diagnosis.  She denies treauma, fever, nor any other sign of current illness.  She is tearful as if in much pain.

## 2015-10-28 NOTE — ED Notes (Signed)
Pt and family reports understanding of discharge information. No questions at time of discharge

## 2015-10-28 NOTE — ED Provider Notes (Signed)
  Face-to-face evaluation   History: She complains of headache for one week. No known trauma. No fever. No improvement with over-the-counter medications.  Physical exam: Alert, elderly patient with mild intermittent tremor. She is lucid. Neck is tender bilateral paravertebral musculature and the posterior scalp in the occiput area, is tender. No meningismus. She is alert, oriented, cooperative and follows commands.  Initial clinical impression- likely musculoskeletal pain, query degenerative joint disease, cervical.  Medical screening examination/treatment/procedure(s) were conducted as a shared visit with non-physician practitioner(s) and myself.  I personally evaluated the patient during the encounter  Daleen Bo, MD 10/30/15 [id number]

## 2015-10-28 NOTE — ED Provider Notes (Signed)
CSN: [id number]     Arrival date & time 10/28/15  1650 History   First MD Initiated Contact with Patient 10/28/15 1821     Chief Complaint  Patient presents with  . Headache   HPI Comments: 65 year old female presents with acute onset of a headache for the past 5 days. He has had a history of similar headaches in the past which have always been treated conservatively and have resolved within a couple days. She first noticed her headache when she woke up 5 days ago, it is the worst she's ever had and has lasted the longest. Reports associated neck soreness. Past medical history is significant for Parkinson disease, liver cancer, dizziness upon standing. She is currently receiving chemotherapy. Denies fever, loss of consciousness, trauma, blurry vision, nausea, vomiting, confusion. She sees Neurology at [facility].   Patient is a 65 y.o. female presenting with headaches.  Headache Associated symptoms: dizziness and neck stiffness   Associated symptoms: no fever, no photophobia and no weakness     Past Medical History  Diagnosis Date  . TIA (transient ischemic attack) 2012    Patient reported, did not seek physician help  . Hay fever   . Arthritis of left hand   . Anxiety   . Hypertension    Past Surgical History  Procedure Laterality Date  . Appendectomy  1973   Family History  Problem Relation Age of Onset  . Aneurysm Brother   . Diabetes Mother    Social History  Substance Use Topics  . Smoking status: Former Research scientist (life sciences)  . Smokeless tobacco: None  . Alcohol Use: No   OB History    No data available     Review of Systems  Constitutional: Negative for fever.  Eyes: Negative for photophobia and visual disturbance.  Musculoskeletal: Positive for neck stiffness.  Neurological: Positive for dizziness and headaches. Negative for syncope and weakness.  All other systems reviewed and are negative.     Allergies  Penicillins and Sulfa antibiotics  Home Medications   Prior to  Admission medications   Medication Sig Start Date End Date Taking? Authorizing Provider  amantadine (SYMMETREL) 100 MG capsule Take 100 mg by mouth 2 (two) times daily as needed. pain 09/01/15  Yes Historical Provider, MD  Ascorbic Acid (VITAMIN C) 500 MG CAPS Take 500 mg by mouth daily. 08/27/11  Yes Angelica Ran, MD  aspirin EC 81 MG tablet Take 81 mg by mouth daily.   Yes Historical Provider, MD  Carbidopa-Levodopa ER (SINEMET CR) 25-100 MG tablet controlled release Take 1.5 tablets by mouth 3 (three) times daily.  10/03/14  Yes Historical Provider, MD  Cholecalciferol (VITAMIN D-1000 MAX ST) 1000 units tablet Take 1,000 Units by mouth daily.   Yes Historical Provider, MD  ibuprofen (ADVIL,MOTRIN) 200 MG tablet Take 400 mg by mouth every 6 (six) hours as needed for moderate pain.   Yes Historical Provider, MD  Multiple Vitamins-Minerals (MULTIVITAMIN PO) Take 1 tablet by mouth 4 (four) times a week.    Yes Historical Provider, MD  omega-3 acid ethyl esters (LOVAZA) 1 G capsule Take 1 g by mouth daily.    Yes Historical Provider, MD  Glucosamine 500 MG CAPS Take 1 capsule (500 mg total) by mouth daily. Patient not taking: Reported on 11/11/2014 08/27/11   Angelica Ran, MD  ibuprofen (ADVIL,MOTRIN) 600 MG tablet Take 1 tablet (600 mg total) by mouth every 6 (six) hours as needed. Patient not taking: Reported on 10/28/2015 11/11/14   Ankit  Kathrynn Humble, MD  ibuprofen (ADVIL,MOTRIN) 600 MG tablet Take 1 tablet (600 mg total) by mouth every 6 (six) hours as needed. Patient not taking: Reported on 10/28/2015 11/11/14   Varney Biles, MD  metoprolol tartrate (LOPRESSOR) 25 MG tablet Take 1 tablet (25 mg total) by mouth 2 (two) times daily. Patient not taking: Reported on 11/11/2014 08/27/11 08/26/12  Angelica Ran, MD   BP 171/103 mmHg  Pulse 75  Temp(Src) 98 F (36.7 C) (Oral)  Resp 16  SpO2 99%   Physical Exam  Constitutional: She is oriented to person, place, and time. She appears  well-developed and well-nourished. No distress.  HENT:  Head: Normocephalic and atraumatic.  Eyes: Conjunctivae are normal. Pupils are equal, round, and reactive to light. Right eye exhibits no discharge. Left eye exhibits no discharge. No scleral icterus.  Neck: Normal range of motion.  No deformity. No tenderness of C-spine. Tenderness to palpation of R trapezius muscle.  Cardiovascular: Normal rate and regular rhythm.  Exam reveals no gallop and no friction rub.   No murmur heard. Pulmonary/Chest: Effort normal and breath sounds normal. No respiratory distress. She has no wheezes. She has no rales. She exhibits no tenderness.  Neurological: She is alert and oriented to person, place, and time.  Mental Status:  Alert, oriented, thought content appropriate, able to give a coherent history. There is some mix up of words with her speech without evidence of aphasia. Able to follow 2 step commands without difficulty.  Cranial Nerves:  II:  Peripheral visual fields grossly normal, pupils equal, round, reactive to light III,IV, VI: ptosis not present, extra-ocular motions intact bilaterally  V,VII: smile symmetric, facial light touch sensation equal VIII: hearing grossly normal to voice  X: uvula elevates symmetrically  XI: bilateral shoulder shrug symmetric and strong XII: midline tongue extension without fassiculations Motor:  Normal tone. 5/5 in upper and lower extremities bilaterally including strong and equal grip strength and dorsiflexion/plantar flexion Sensory: Pinprick and light touch normal in all extremities.  Deep Tendon Reflexes: 2+ and symmetric in the biceps and patella Cerebellar: normal finger-to-nose with bilateral upper extremities Gait: Slow moving, but able to ambulate without difficulty. Reports dizziness with standing which resolves after several steps. CV: distal pulses palpable throughout     Skin: Skin is warm and dry.  Psychiatric: She has a normal mood and affect.     ED Course  Procedures (including critical care time) Labs Review Labs Reviewed - No data to display  Imaging Review Ct Head Wo Contrast  10/28/2015  CLINICAL DATA:  Occipital area headaches for 1 week. EXAM: CT HEAD WITHOUT CONTRAST CT CERVICAL SPINE WITHOUT CONTRAST TECHNIQUE: Multidetector CT imaging of the head and cervical spine was performed following the standard protocol without intravenous contrast. Multiplanar CT image reconstructions of the cervical spine were also generated. COMPARISON:  None. FINDINGS: CT HEAD FINDINGS Mild nonspecific white matter abnormality is identified involving bilateral parietal lobes. There is no evidence for acute cortical infarct, acute intracranial hemorrhage or mass. No abnormal extra-axial fluid collections identified. The paranasal sinuses and mastoid air cells are clear. The calvarium is intact. CT CERVICAL SPINE FINDINGS Straightening of normal cervical lordosis. The facet joints are aligned. The prevertebral soft tissue space is normal. Disc space narrowing and ventral endplate spurring is noted at seat 4 5, C5-6 and C6-7. No fractures or subluxations identified. IMPRESSION: 1. No acute intracranial abnormality. 2. Subtle nonspecific white matter abnormality is identified within bilateral parietal lobes likely related to chronic microvascular disease.  3. Advanced cervical spondylosis. 4. No evidence for cervical spine fracture. Electronically Signed   By: Kerby Moors M.D.   On: 10/28/2015 21:33   Ct Cervical Spine Wo Contrast  10/28/2015  CLINICAL DATA:  Occipital area headaches for 1 week. EXAM: CT HEAD WITHOUT CONTRAST CT CERVICAL SPINE WITHOUT CONTRAST TECHNIQUE: Multidetector CT imaging of the head and cervical spine was performed following the standard protocol without intravenous contrast. Multiplanar CT image reconstructions of the cervical spine were also generated. COMPARISON:  None. FINDINGS: CT HEAD FINDINGS Mild nonspecific white matter  abnormality is identified involving bilateral parietal lobes. There is no evidence for acute cortical infarct, acute intracranial hemorrhage or mass. No abnormal extra-axial fluid collections identified. The paranasal sinuses and mastoid air cells are clear. The calvarium is intact. CT CERVICAL SPINE FINDINGS Straightening of normal cervical lordosis. The facet joints are aligned. The prevertebral soft tissue space is normal. Disc space narrowing and ventral endplate spurring is noted at seat 4 5, C5-6 and C6-7. No fractures or subluxations identified. IMPRESSION: 1. No acute intracranial abnormality. 2. Subtle nonspecific white matter abnormality is identified within bilateral parietal lobes likely related to chronic microvascular disease. 3. Advanced cervical spondylosis. 4. No evidence for cervical spine fracture. Electronically Signed   By: Kerby Moors M.D.   On: 10/28/2015 21:33   I have personally reviewed and evaluated these images and lab results as part of my medical decision-making.   EKG Interpretation None      MDM   Final diagnoses:  Nonintractable episodic headache, unspecified headache type   65 year old female who presents with acute onset of HA. She has had the same HA in the past however she states this one is the worst and is lasting the longest. IVF, Toradol, Benadryl, Reglan given with no improvement of HA. It's most likely her symptoms are MSK. She has a lot of muscle tension in the R trapezius and neck stiffness. CT of head is negative however CT of neck shows advanced cervical spondylosis. Valium and Dilaudid given which has provided relief. Family notified of results as patient is sleeping. Advised f/u with Neurologist. Robaxin rx given. Shared visit with Dr. Eulis Foster. Patient / Family / Caregiver informed of clinical course, understand medical decision-making process, and agree with plan.     Recardo Evangelist, PA-C 10/29/15 1212  Daleen Bo, MD 10/30/15 [phone]

## 2015-10-28 NOTE — ED Notes (Signed)
MD at bedside. 

## 2015-10-28 NOTE — ED Notes (Signed)
Patient transported to CT 

## 2017-04-05 IMAGING — CT CT CERVICAL SPINE W/O CM
4 of 6 series · 14 of 33 positions shown, 16 images · non-contrast
Comparison: None.

CLINICAL DATA: Occipital area headaches for 1 week.

EXAM:
CT HEAD WITHOUT CONTRAST
CT CERVICAL SPINE WITHOUT CONTRAST
TECHNIQUE: Multidetector CT imaging of the head and cervical spine was
performed following the standard protocol without intravenous
contrast. Multiplanar CT image reconstructions of the cervical spine
were also generated.

[Series 4: c-spine st · axial · 0.28mm/px · z∈[+1001,+1081]mm · 3 of 81 slices shown, 4 images]
[im 21/81  soft-tissue]
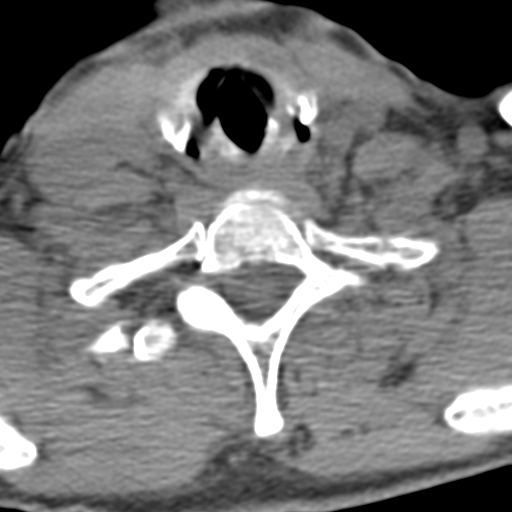
[im 21/81  bone]
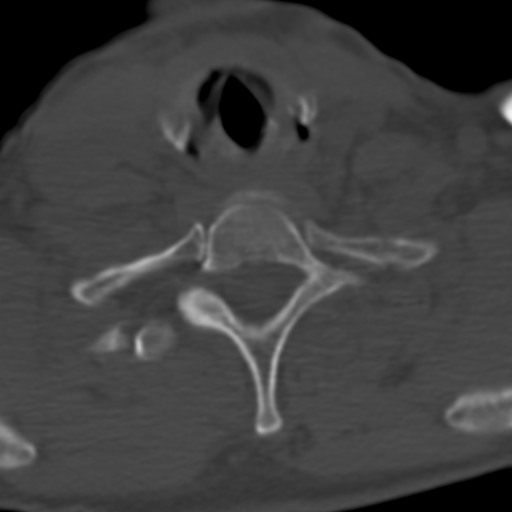
[im 41/81  bone]
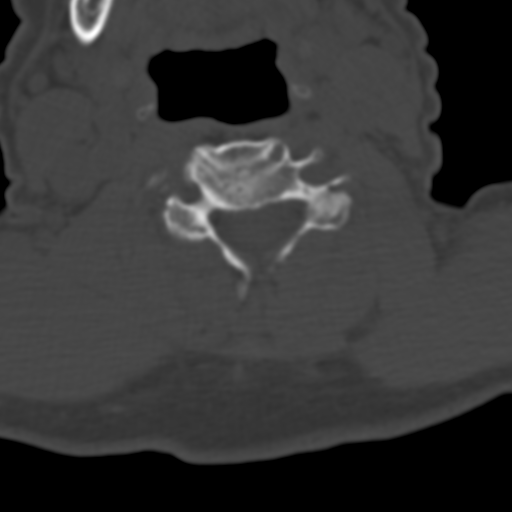
[im 61/81  bone]
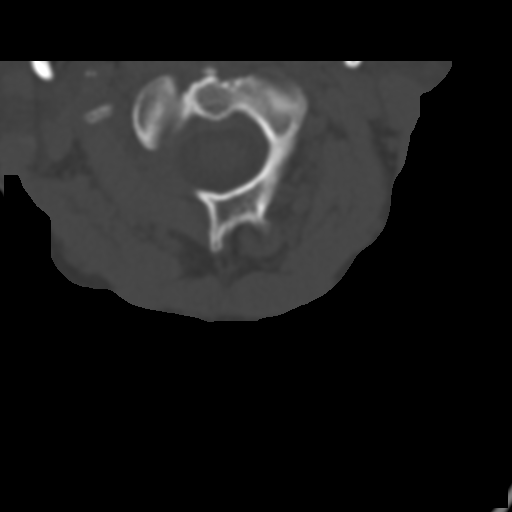

[Series 7: axial recon · axial · 0.26mm/px · z∈[+963,+1043]mm · 3 of 92 slices shown]
[im 23/92  bone]
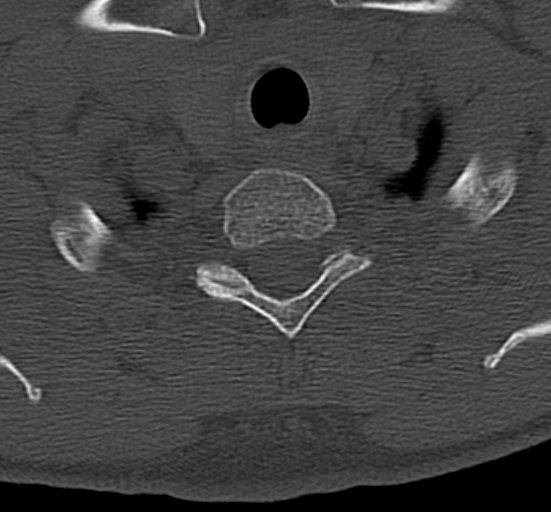
[im 46/92  bone]
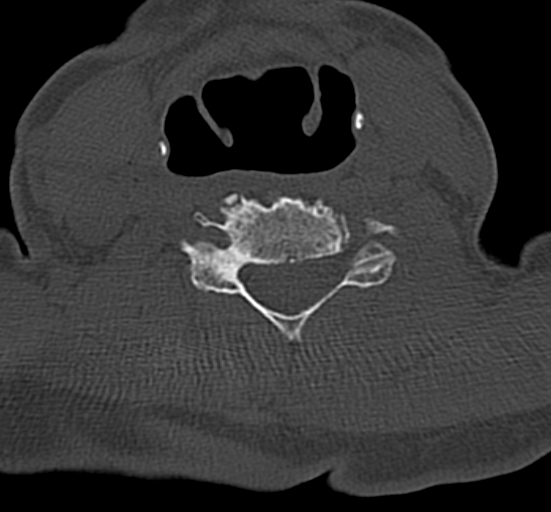
[im 69/92  bone]
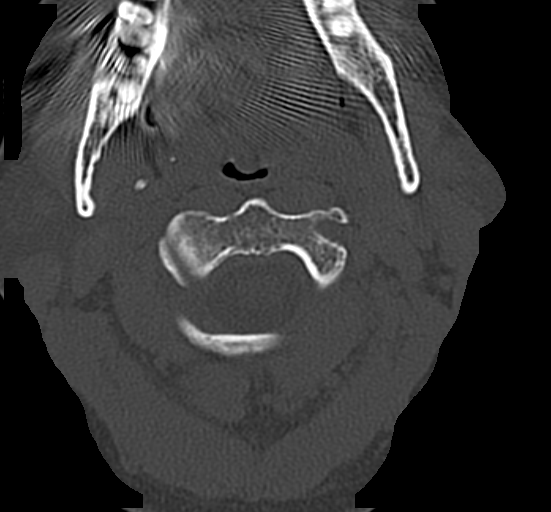

[Series 8: coronal · coronal · 0.23mm/px · 3 of 61 slices shown]
[im 17/61  bone]
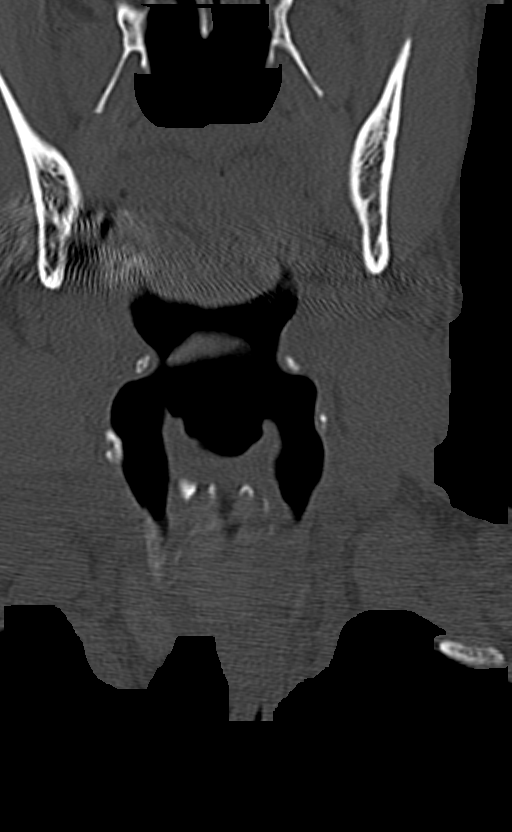
[im 26/61  bone]
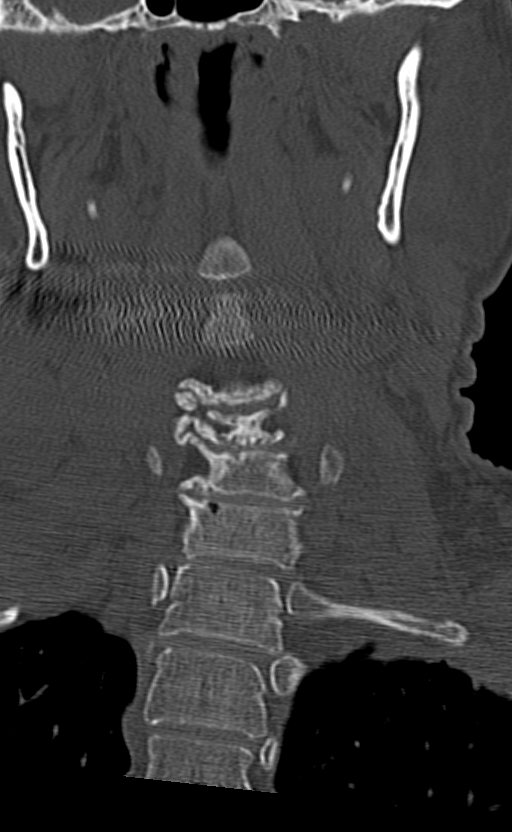
[im 35/61  bone]
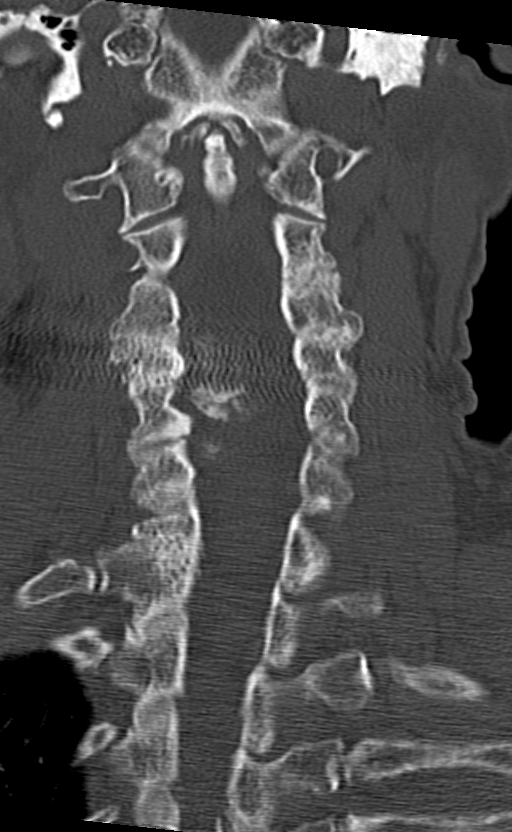

[Series 9: sagittal · sagittal · 0.27mm/px · 5 of 61 slices shown, 6 images]
[im 21/61  bone]
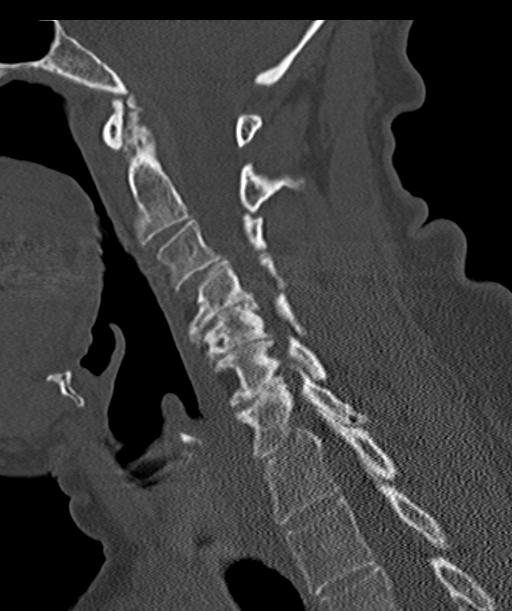
[im 26/61  bone]
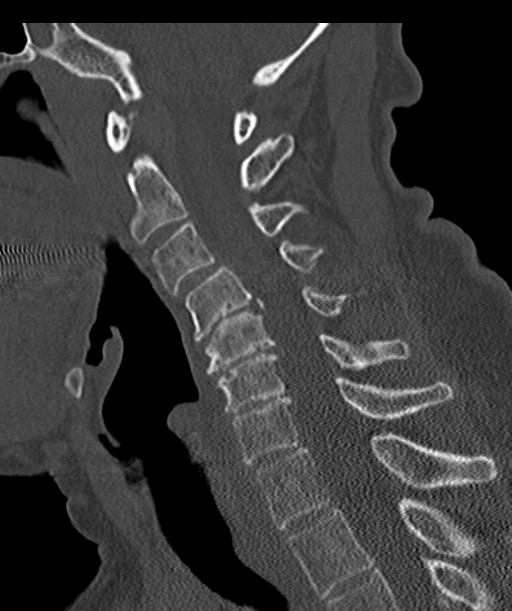
[im 31/61  soft-tissue]
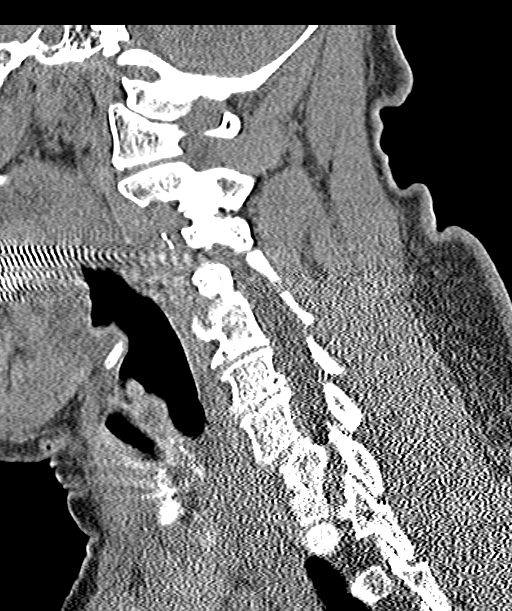
[im 31/61  bone]
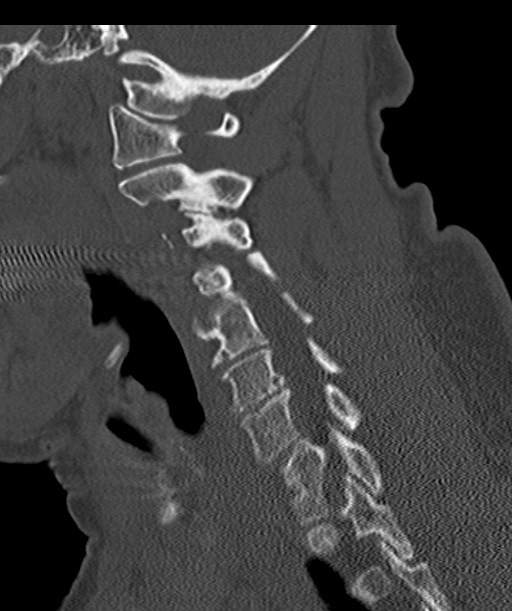
[im 36/61  bone]
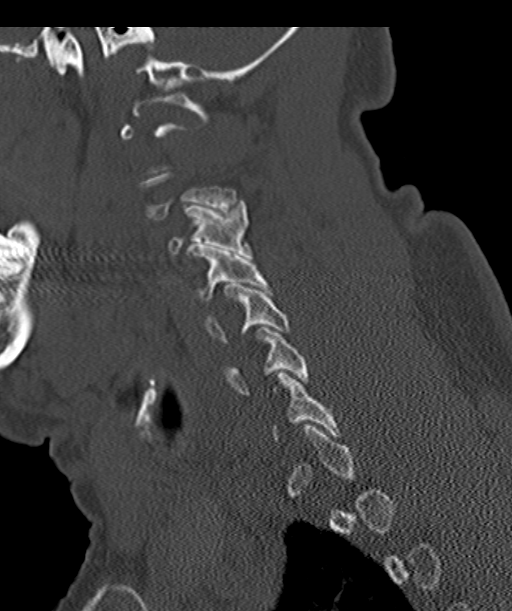
[im 41/61  bone]
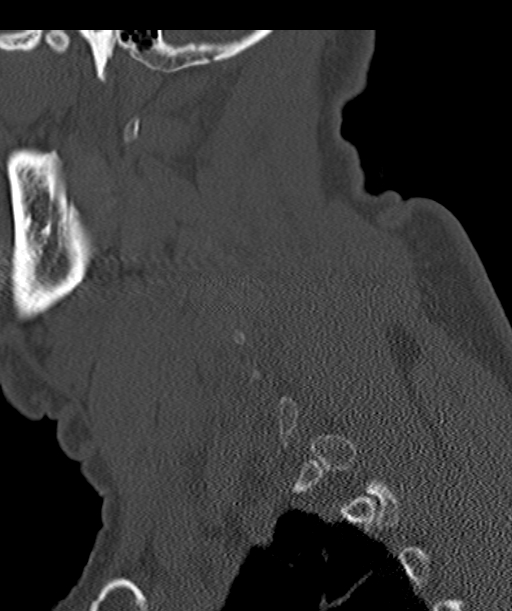

[14 of 33 positions shown; findings below may reference images not displayed]

FINDINGS: CT HEAD FINDINGS

Mild nonspecific white matter abnormality is identified involving
bilateral parietal lobes. There is no evidence for acute cortical
infarct, acute intracranial hemorrhage or mass. No abnormal
extra-axial fluid collections identified. The paranasal sinuses and
mastoid air cells are clear. The calvarium is intact.

CT CERVICAL SPINE FINDINGS

Straightening of normal cervical lordosis. The facet joints are
aligned. The prevertebral soft tissue space is normal. Disc space
narrowing and ventral endplate spurring is noted at seat 4 5, C5-6
and C6-7. No fractures or subluxations identified.
IMPRESSION: 1. No acute intracranial abnormality.
2. Subtle nonspecific white matter abnormality is identified within
bilateral parietal lobes likely related to chronic microvascular
disease.
3. Advanced cervical spondylosis.
4. No evidence for cervical spine fracture.

## 2018-07-21 ENCOUNTER — Encounter (HOSPITAL_COMMUNITY): Payer: Self-pay | Admitting: Emergency Medicine

## 2018-07-21 ENCOUNTER — Emergency Department (HOSPITAL_COMMUNITY): Payer: Medicare Other

## 2018-07-21 ENCOUNTER — Other Ambulatory Visit: Payer: Self-pay

## 2018-07-21 ENCOUNTER — Emergency Department (HOSPITAL_COMMUNITY)
Admission: EM | Admit: 2018-07-21 | Discharge: 2018-07-21 | Disposition: A | Discharge status: Other Acute Inpatient Hospital | Payer: Medicare Other | Attending: Emergency Medicine | Admitting: Emergency Medicine

## 2018-07-21 DIAGNOSIS — I10 Essential (primary) hypertension: Secondary | ICD-10-CM | POA: Insufficient documentation

## 2018-07-21 DIAGNOSIS — Z9889 Other specified postprocedural states: Secondary | ICD-10-CM | POA: Diagnosis not present

## 2018-07-21 DIAGNOSIS — Z79899 Other long term (current) drug therapy: Secondary | ICD-10-CM | POA: Insufficient documentation

## 2018-07-21 DIAGNOSIS — W19XXXA Unspecified fall, initial encounter: Secondary | ICD-10-CM

## 2018-07-21 DIAGNOSIS — Y9301 Activity, walking, marching and hiking: Secondary | ICD-10-CM | POA: Diagnosis not present

## 2018-07-21 DIAGNOSIS — Y92239 Unspecified place in hospital as the place of occurrence of the external cause: Secondary | ICD-10-CM | POA: Insufficient documentation

## 2018-07-21 DIAGNOSIS — Y999 Unspecified external cause status: Secondary | ICD-10-CM | POA: Insufficient documentation

## 2018-07-21 DIAGNOSIS — G20 Parkinson's disease: Secondary | ICD-10-CM | POA: Insufficient documentation

## 2018-07-21 DIAGNOSIS — W1830XA Fall on same level, unspecified, initial encounter: Secondary | ICD-10-CM | POA: Diagnosis not present

## 2018-07-21 DIAGNOSIS — S0083XA Contusion of other part of head, initial encounter: Secondary | ICD-10-CM

## 2018-07-21 LAB — PROTIME-INR
INR: 1.34
Prothrombin Time: 16.5 seconds — ABNORMAL HIGH (ref 11.4–15.2)

## 2018-07-21 LAB — COMPREHENSIVE METABOLIC PANEL
ALT: 61 U/L — AB (ref 0–44)
AST: 61 U/L — AB (ref 15–41)
Albumin: 2.9 g/dL — ABNORMAL LOW (ref 3.5–5.0)
Alkaline Phosphatase: 128 U/L — ABNORMAL HIGH (ref 38–126)
Anion gap: 6 (ref 5–15)
BILIRUBIN TOTAL: 1.5 mg/dL — AB (ref 0.3–1.2)
BUN: 10 mg/dL (ref 8–23)
CO2: 23 mmol/L (ref 22–32)
CREATININE: 0.7 mg/dL (ref 0.44–1.00)
Calcium: 8.9 mg/dL (ref 8.9–10.3)
Chloride: 109 mmol/L (ref 98–111)
GFR calc Af Amer: >60 mL/min (ref >60)
GFR calc non Af Amer: >60 mL/min (ref >60)
Glucose, Bld: 102 mg/dL — ABNORMAL HIGH (ref 70–99)
Potassium: 3.7 mmol/L (ref 3.5–5.1)
Sodium: 138 mmol/L (ref 135–145)
TOTAL PROTEIN: 6.2 g/dL — AB (ref 6.5–8.1)

## 2018-07-21 LAB — CBC
HCT: 44 % (ref 36.0–46.0)
Hemoglobin: 14.6 g/dL (ref 12.0–15.0)
MCH: 31 pg (ref 26.0–34.0)
MCHC: 33.2 g/dL (ref 30.0–36.0)
MCV: 93.4 fL (ref 80.0–100.0)
Platelets: 124 10*3/uL — ABNORMAL LOW (ref 150–400)
RBC: 4.71 MIL/uL (ref 3.87–5.11)
RDW: 13.4 % (ref 11.5–15.5)
WBC: 6.4 10*3/uL (ref 4.0–10.5)
nRBC: 0 % (ref 0.0–0.2)

## 2018-07-21 LAB — AMMONIA: Ammonia: 47 umol/L — ABNORMAL HIGH (ref 9–35)

## 2018-07-21 MED ORDER — ACETAMINOPHEN 160 MG/5ML PO SOLN
500.0000 mg | Freq: Once | ORAL | Status: AC
  Administered 2018-07-21: 500 mg via ORAL
  Filled 2018-07-21: qty 20.3

## 2018-07-21 MED ORDER — ACETAMINOPHEN 500 MG PO TABS
1000.0000 mg | ORAL_TABLET | Freq: Once | ORAL | Status: DC
  Filled 2018-07-21: qty 2

## 2018-07-21 NOTE — ED Notes (Signed)
PT[facility] called @ 1030-per Katie, RN-called by Levada Dy

## 2018-07-21 NOTE — ED Provider Notes (Addendum)
[place] EMERGENCY DEPARTMENT Provider Note   CSN: [id number] Arrival date & time: 07/21/18  6962   History   Chief Complaint Chief Complaint  Patient presents with  . Fall    HPI Carrie Ramsey is a 68 y.o. 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  Patient with recent extensive stay at [facility] with complicated hospitalization secondary to placement of deep brain stimulator electrode which led to seizures and hemorrhagic stroke.  Patient was admitted to SNF afterwards for rehabilitation.  Patient states that since she has been in rehab she has felt that her body will tell her that it is okay to walk but that her brain is not communicating with how to do that.  States that she fell yesterday and hit her face.  Denies trauma to any other location on her body.  Denies any pain anywhere else.  Patient states that she did not have any sudden numbness or weakness, she just was unable to walk correctly.  Patient denies any vision changes, or nausea.  Does report that she has had chronic vomiting and headache after recent hospitalization, this has not worsened.  Patient is also endorsing low back pain which has also been chronic for her for over a week.    Past Medical History:  Diagnosis Date  . Anxiety   . Arthritis of left hand   . Hay fever   . Hypertension   . TIA (transient ischemic attack) 2012   Patient reported, did not seek physician help    Patient Active Problem List   Diagnosis Date Noted  . Depression, major 09/02/2011  . Hypertension 09/02/2011  . Tremor 08/27/2011  . Hay fever   . Arthritis of left hand   . Anxiety     Past Surgical History:  Procedure Laterality Date  . APPENDECTOMY  1973     OB History   No obstetric history on file.      Home Medications    Prior to Admission medications   Medication Sig  Start Date End Date Taking? Authorizing Provider  acetaminophen (TYLENOL) 500 MG tablet Take 500 mg by mouth every 6 (six) hours as needed for headache (do not exceed more than 3000mg  in 24 hours).   Yes [provider]  Ascorbic Acid (VITAMIN C) 500 MG CAPS Take 500 mg by mouth daily. 08/27/11  Yes Angelica Ran, MD  calcium carbonate (TUMS - DOSED IN MG ELEMENTAL CALCIUM) 500 MG chewable tablet Chew 1 tablet by mouth 3 (three) times daily as needed for indigestion or heartburn.   Yes [provider]  Carbidopa-Levodopa ER (SINEMET CR) 25-100 MG tablet controlled release Take 1 tablet by mouth 4 (four) times daily.  10/03/14  Yes [provider]  carboxymethylcellulose (REFRESH PLUS) 0.5 % SOLN Place 1 drop into both eyes 4 (four) times daily. Wait 3-5 minutes before administering another eye drop   Yes [provider]  lactulose (CHRONULAC) 10 GM/15ML solution Take 50 g by mouth 4 (four) times daily.   Yes [provider]  levETIRAcetam (KEPPRA) 500 MG tablet Take 500 mg by mouth 2 (two) times daily.   Yes [provider]  LORazepam (ATIVAN) 0.5 MG tablet Take 0.25 mg by mouth every 12 (twelve) hours as needed for anxiety.   Yes [provider]  magnesium oxide (MAG-OX) 400 MG tablet Take 400 mg by mouth 2 (  two) times daily.   Yes [provider]  Melatonin 3 MG TABS Take 3 mg by mouth at bedtime.   Yes [provider]  metoprolol succinate (TOPROL-XL) 25 MG 24 hr tablet Take 25 mg by mouth daily.   Yes [provider]  ondansetron (ZOFRAN) 4 MG tablet Take 4 mg by mouth every 4 (four) hours as needed for nausea.   Yes [provider]  rifaximin (XIFAXAN) 550 MG TABS tablet Take 550 mg by mouth 2 (two) times daily.   Yes [provider]  sodium chloride (OCEAN) 0.65 % SOLN nasal spray Place 1 spray into both nostrils every 4 (four) hours as needed for congestion.   Yes [provider]  tuberculin (TUBERSOL) 5 UNIT/0.1ML injection Inject 5 Units into the skin daily. For 14 days. 07/13/18 07/28/18 Yes [provider]  methocarbamol (ROBAXIN) 500 MG tablet Take 1 tablet (500 mg total) by mouth 2 (two) times daily. Patient not taking: Reported on 07/21/2018 10/28/15   Recardo Evangelist, PA-C  metoprolol tartrate (LOPRESSOR) 25 MG tablet Take 1 tablet (25 mg total) by mouth 2 (two) times daily. Patient not taking: Reported on 11/11/2014 08/27/11 10/28/15  Angelica Ran, MD    Family History Family History  Problem Relation Age of Onset  . Aneurysm Brother   . Diabetes Mother     Social History Social History   Tobacco Use  . Smoking status: Former Smoker  Substance Use Topics  . Alcohol use: No  . Drug use: Not on file     Allergies   Penicillins and Sulfa antibiotics   Review of Systems Review of Systems  Constitutional: Negative for fatigue.  HENT: Negative for facial swelling.   Respiratory: Negative for shortness of breath.   Cardiovascular: Negative for chest pain.  Gastrointestinal: Positive for vomiting. Negative for nausea.  Musculoskeletal: Positive for back pain and gait problem.  Neurological: Positive for tremors, weakness and headaches. Negative for dizziness and light-headedness.  All other systems reviewed and are negative.    Physical Exam Updated Vital Signs BP 105/63   Pulse 67   Temp 98.3 F (36.8 C) (Oral)   Resp 16   SpO2 95%   Physical Exam Vitals signs and nursing note reviewed.  Constitutional:      General: She is not in acute distress.    Appearance: Normal appearance. She is normal weight.  HENT:     Head: Normocephalic.     Comments: Some mild ecchymosis on R cheek bone with no surrounding erythema or swelling    Nose: Nose normal.  Eyes:     Extraocular Movements: Extraocular movements intact.     Conjunctiva/sclera: Conjunctivae normal.     Pupils: Pupils are equal, round, and reactive to light.    Neck:     Musculoskeletal: Normal range of motion.  Cardiovascular:     Rate and Rhythm: Normal rate and regular rhythm.  Pulmonary:     Effort: Pulmonary effort is normal.     Breath sounds: Normal breath sounds.  Abdominal:     General: There is no distension.     Palpations: Abdomen is soft.  Skin:    General: Skin is warm.  Neurological:     Mental Status: She is alert. Mental status is at baseline.     Cranial Nerves: No cranial nerve deficit.     Motor: Weakness and tremor present.     Comments: Patient with known weakness on Left side, 3/5 strength  on LUE, 5/5 on RUE  Psychiatric:        Mood and Affect: Mood normal.        Thought Content: Thought content normal.        Judgment: Judgment normal.    ED Treatments / Results  Labs (all labs ordered are listed, but only abnormal results are displayed) Labs Reviewed  COMPREHENSIVE METABOLIC PANEL - Abnormal; Notable for the following components:      Result Value   Glucose, Bld 102 (*)    Total Protein 6.2 (*)    Albumin 2.9 (*)    AST 61 (*)    ALT 61 (*)    Alkaline Phosphatase 128 (*)    Total Bilirubin 1.5 (*)    All other components within normal limits  AMMONIA - Abnormal; Notable for the following components:   Ammonia 47 (*)    All other components within normal limits  PROTIME-INR - Abnormal; Notable for the following components:   Prothrombin Time 16.5 (*)    All other components within normal limits  CBC - Abnormal; Notable for the following components:   Platelets 124 (*)    All other components within normal limits    EKG None  Radiology Dg Lumbar Spine Complete  Result Date: 07/21/2018 CLINICAL DATA:  Right-sided back pain. EXAM: LUMBAR SPINE - COMPLETE 4+ VIEW COMPARISON:  No recent. FINDINGS: Lumbar spine numbered the lowest segmented appearing lumbar shaped vertebrae on lateral view as L4. Diffuse multilevel degenerative change with 4 mm anterolisthesis L4 on L5. No evidence of fracture  calcifications right upper quadrant consistent with known gallstones. Pelvic calcifications noted most likely calcified uterine fibroids. IMPRESSION: 1. Diffuse multilevel degenerative change with 4 mm anterolisthesis L4 on L5. No acute bony abnormality. 2.  Gallstones. Electronically Signed   By: Marcello Moores  Register   On: 07/21/2018 10:20   Ct Head Wo Contrast  Result Date: 07/21/2018 CLINICAL DATA:  Fall hitting the right frontal skull. Headache. No loss of consciousness. History of Parkinson's disease. Initial encounter. EXAM: CT HEAD WITHOUT CONTRAST TECHNIQUE: Contiguous axial images were obtained from the base of the skull through the vertex without intravenous contrast. COMPARISON:  10/28/2015 FINDINGS: Brain: There has been interval placement of bilateral deep brain stimulators. There is no evidence of acute infarct, intracranial hemorrhage, mass, midline shift, or extra-axial fluid collection. The ventricles and sulci are within normal limits for age. Patchy bilateral cerebral white matter hypodensities may have mildly progressed and are nonspecific but compatible with mild chronic small vessel ischemic disease. Vascular: Calcified atherosclerosis at the skull base. No hyperdense vessel. Skull: No fracture or focal osseous lesion. Sinuses/Orbits: Trace right mastoid effusion. Clear paranasal sinuses. Bilateral cataract extraction. Other: None. IMPRESSION: 1. No evidence of acute intracranial abnormality. 2. Mild chronic small vessel ischemic disease. Electronically Signed   By: Logan Bores M.D.   On: 07/21/2018 09:55    Procedures Procedures (including critical care time)  Medications Ordered in ED Medications  acetaminophen (TYLENOL) solution 500 mg (500 mg Oral Given 07/21/18 1102)    Initial Impression / Assessment and Plan / ED Course  I have reviewed the triage vital signs and the nursing notes.  Pertinent labs & imaging results that were available during my care of the patient were  reviewed by me and considered in my medical decision making (see chart for details).   68 yo female here after recent prolonged hospitalization after deep brain stimulator placed for PD with post-op seizures, as well as hemorrhage. Patient  sent to SNF on 07/13/2018 and fall reported on 07/20/2018. Here for evaluation after fall with bruising noted on R cheek. Patient with some chronic low back pain that worsened after fall. Low back nontender on exam with no associated bruising. Will obtain CT head w/o and DG of lumbar spine. Tylenol for pain.   CT head w/o and DG lumbar spine with no acute findings. Patient given strict return precautions and is safe for discharge back to SNF based on current clinical findings.   Patient's daughter in room who notes that patient seems more confused to her stating that her story does not check out. Reports that she has been getting more and more confused since discharge even though she is taking lactulose. Will perform CMP, CBC and ammonia.   CMP, CBC and ammonia are stable and improved from recent hospitalization.  Discussed results with daughter and encouraged her to follow-up with her PCP in order to evaluate for possible dementia.  They are to continue using lactulose as prescribed by [facility].  Also encouraged to follow-up with gastroenterologist as well as neurology.  Again given strict return precautions and is safe for discharge back to SNF based on current clinical findings.  [place] Lance [place], DO PGY-2, [facility]   Final Clinical Impressions(s) / ED Diagnoses   Final diagnoses:  Fall, initial encounter  Contusion of face, initial encounter    ED Discharge Orders    None       Tailynn Armetta, [place], DO 07/21/18 [place], DO 07/21/18 1033    Orvetta Danielski, [place], DO 07/21/18 [place], DO 07/21/18 1256

## 2018-07-21 NOTE — ED Notes (Signed)
Patient was given a cup of water. 

## 2018-07-21 NOTE — ED Notes (Signed)
Patient transported to CT 

## 2018-07-21 NOTE — ED Triage Notes (Signed)
Per [facility] pt coming from [facility] rehab after having a fall yesterday. Patient at facility after a stroke 2 weeks ago. Staff states she forgot about her left sided deficits and tried to get up and caused her to fall. Patient landed on ground hitting head, at the time family did not want patient to be sent to hospital. Daughter visited patient this am and states right cheek is swollen. Patient adds her back has been hurting for a week.

## 2018-07-21 NOTE — Discharge Instructions (Addendum)
Please do not hesitate to return if your symptoms worsen or do not improve or you have worse headaches, change in vision, weakness or vomiting.  Also if you notice any new swelling or bruising then you may return to the emergency room.  Please be sure to follow-up with your regular doctors as well as your specialist.

## 2018-08-10 ENCOUNTER — Emergency Department (HOSPITAL_COMMUNITY): Payer: Medicare Other

## 2018-08-10 ENCOUNTER — Other Ambulatory Visit: Payer: Self-pay

## 2018-08-10 ENCOUNTER — Emergency Department (HOSPITAL_COMMUNITY)
Admission: EM | Admit: 2018-08-10 | Discharge: 2018-08-10 | Payer: Medicare Other | Attending: Emergency Medicine | Admitting: Emergency Medicine

## 2018-08-10 ENCOUNTER — Encounter (HOSPITAL_COMMUNITY): Payer: Self-pay

## 2018-08-10 DIAGNOSIS — I10 Essential (primary) hypertension: Secondary | ICD-10-CM | POA: Diagnosis not present

## 2018-08-10 DIAGNOSIS — M25551 Pain in right hip: Secondary | ICD-10-CM | POA: Diagnosis not present

## 2018-08-10 DIAGNOSIS — M545 Low back pain: Secondary | ICD-10-CM | POA: Insufficient documentation

## 2018-08-10 DIAGNOSIS — Y999 Unspecified external cause status: Secondary | ICD-10-CM | POA: Insufficient documentation

## 2018-08-10 DIAGNOSIS — W1789XA Other fall from one level to another, initial encounter: Secondary | ICD-10-CM | POA: Diagnosis not present

## 2018-08-10 DIAGNOSIS — Y9210 Unspecified residential institution as the place of occurrence of the external cause: Secondary | ICD-10-CM | POA: Diagnosis not present

## 2018-08-10 DIAGNOSIS — Y939 Activity, unspecified: Secondary | ICD-10-CM | POA: Diagnosis not present

## 2018-08-10 DIAGNOSIS — Z87891 Personal history of nicotine dependence: Secondary | ICD-10-CM | POA: Diagnosis not present

## 2018-08-10 DIAGNOSIS — R51 Headache: Secondary | ICD-10-CM | POA: Diagnosis not present

## 2018-08-10 DIAGNOSIS — Z79899 Other long term (current) drug therapy: Secondary | ICD-10-CM | POA: Insufficient documentation

## 2018-08-10 DIAGNOSIS — W19XXXA Unspecified fall, initial encounter: Secondary | ICD-10-CM

## 2018-08-10 NOTE — Discharge Instructions (Signed)
It was my pleasure taking care of you today!  Fortunately, your imaging today was reassuring.  It does not look like there were any injuries from your fall.  You can take Tylenol as needed for your pain.  Follow up with your primary care doctor.  Return to the emergency department for new or worsening symptoms, any additional concerns.

## 2018-08-10 NOTE — ED Notes (Signed)
Patient transported to CT 

## 2018-08-10 NOTE — ED Triage Notes (Signed)
Pt BIB [facility] for eval s/p fall. Pt reports she attempted to stand up to go to bathroom and reports she slipped on tile floor in socks. Pt reports she did strike her head, no LOC. Endorses lower back pain 6/10. Pt denies anticoagulation. GCS 15, CAOx4 on arrival, NAD

## 2018-08-10 NOTE — ED Notes (Signed)
Patient son stated he would take patient home/called [facility] to cancel trip

## 2018-08-10 NOTE — ED Notes (Signed)
[facility] called  

## 2018-08-10 NOTE — ED Notes (Signed)
Patient verbalizes understanding of discharge instructions. Opportunity for questioning and answers were provided. Armband removed by staff, pt discharged from ED with [facility].  

## 2018-08-10 NOTE — ED Provider Notes (Signed)
[facility] Provider Note   CSN: [id number] Arrival date & time: 08/10/18  1842     History   Chief Complaint Chief Complaint  Patient presents with  . Fall    HPI ANDREE HEEG is a 68 y.o. female.  The history is provided by the patient and medical records. No language interpreter was used.  Fall  Associated symptoms include headaches.   KARMIN KASPRZAK is a 68 y.o. female  with a PMH as listed below who presents to the Emergency Department from facility for evaluation after fall.  Patient states that she was given her laxative while at the dining table.  She immediately felt as if she needed to have a bowel movement.  She called out to staff, but no one came to help her.  She did not want to have a bowel movement in her wheelchair while eating dinner, therefore tried to get up and ambulate to the restroom even though she knew that she may fall.  When she got up, she slipped and fell backwards hitting her head.  She is complaining of mild posterior headache as well as right hip pain.  Denies any numbness or weakness.  No saddle anesthesia.  No incontinence.  No syncopal episode or loss of consciousness.  No dizziness.  Occasions taken prior to arrival for symptoms.  Not on anticoagulants.  Past Medical History:  Diagnosis Date  . Anxiety   . Arthritis of left hand   . Hay fever   . Hypertension   . TIA (transient ischemic attack) 2012   Patient reported, did not seek physician help    Patient Active Problem List   Diagnosis Date Noted  . Depression, major 09/02/2011  . Hypertension 09/02/2011  . Tremor 08/27/2011  . Hay fever   . Arthritis of left hand   . Anxiety     Past Surgical History:  Procedure Laterality Date  . APPENDECTOMY  1973     OB History   No obstetric history on file.      Home Medications    Prior to Admission medications   Medication Sig Start Date End Date Taking? Authorizing Provider    acetaminophen (TYLENOL) 500 MG tablet Take 500 mg by mouth every 6 (six) hours as needed for headache (do not exceed more than 3000mg  in 24 hours).    [provider]  Ascorbic Acid (VITAMIN C) 500 MG CAPS Take 500 mg by mouth daily. 08/27/11   Angelica Ran, MD  calcium carbonate (TUMS - DOSED IN MG ELEMENTAL CALCIUM) 500 MG chewable tablet Chew 1 tablet by mouth 3 (three) times daily as needed for indigestion or heartburn.    [provider]  Carbidopa-Levodopa ER (SINEMET CR) 25-100 MG tablet controlled release Take 1 tablet by mouth 4 (four) times daily.  10/03/14   [provider]  carboxymethylcellulose (REFRESH PLUS) 0.5 % SOLN Place 1 drop into both eyes 4 (four) times daily. Wait 3-5 minutes before administering another eye drop    [provider]  lactulose (CHRONULAC) 10 GM/15ML solution Take 50 g by mouth 4 (four) times daily.    [provider]  levETIRAcetam (KEPPRA) 500 MG tablet Take 500 mg by mouth 2 (two) times daily.    [provider]  LORazepam (ATIVAN) 0.5 MG tablet Take 0.25 mg by mouth every 12 (twelve) hours as needed for anxiety.    [provider]  magnesium oxide (MAG-OX) 400 MG tablet Take 400  mg by mouth 2 (two) times daily.    [provider]  Melatonin 3 MG TABS Take 3 mg by mouth at bedtime.    [provider]  methocarbamol (ROBAXIN) 500 MG tablet Take 1 tablet (500 mg total) by mouth 2 (two) times daily. Patient not taking: Reported on 07/21/2018 10/28/15   Recardo Evangelist, PA-C  metoprolol succinate (TOPROL-XL) 25 MG 24 hr tablet Take 25 mg by mouth daily.    [provider]  ondansetron (ZOFRAN) 4 MG tablet Take 4 mg by mouth every 4 (four) hours as needed for nausea.    [provider]  rifaximin (XIFAXAN) 550 MG TABS tablet Take 550 mg by mouth 2 (two) times daily.    [provider]  sodium chloride (OCEAN) 0.65 % SOLN nasal spray Place 1 spray into  both nostrils every 4 (four) hours as needed for congestion.    [provider]    Family History Family History  Problem Relation Age of Onset  . Aneurysm Brother   . Diabetes Mother     Social History Social History   Tobacco Use  . Smoking status: Former Smoker  Substance Use Topics  . Alcohol use: No  . Drug use: Not on file     Allergies   Penicillins and Sulfa antibiotics   Review of Systems Review of Systems  Musculoskeletal: Positive for arthralgias, back pain and myalgias. Negative for neck pain.  Neurological: Positive for headaches. Negative for dizziness, syncope, weakness and numbness.  All other systems reviewed and are negative.    Physical Exam Updated Vital Signs BP 118/78 (BP Location: Right Arm)   Pulse 66   Temp 98.6 F (37 C) (Oral)   Resp 16   Ht 5\' 6"  (1.676 m)   Wt 52.2 kg   SpO2 99%   BMI 18.56 kg/m   Physical Exam Vitals signs and nursing note reviewed.  Constitutional:      General: She is not in acute distress.    Appearance: She is well-developed.  HENT:     Head: Normocephalic and atraumatic.  Neck:     Musculoskeletal: Neck supple.  Cardiovascular:     Rate and Rhythm: Normal rate and regular rhythm.     Heart sounds: Normal heart sounds. No murmur.  Pulmonary:     Effort: Pulmonary effort is normal. No respiratory distress.     Breath sounds: Normal breath sounds.  Abdominal:     General: There is no distension.     Palpations: Abdomen is soft.     Tenderness: There is no abdominal tenderness.  Musculoskeletal:     Comments: Right lateral hip and lumbar paraspinal tenderness.  Mild midline lumbar tenderness, but much more significantly to the right paraspinal region.  Skin:    General: Skin is warm and dry.  Neurological:     Mental Status: She is alert and oriented to person, place, and time.     Comments: Alert, oriented, thought content appropriate, able to give a coherent history. Speech is clear and  goal oriented, able to follow commands.  Cranial Nerves:  II:  Peripheral visual fields grossly normal, pupils equal, round, reactive to light III, IV, VI: EOM intact bilaterally, ptosis not present V,VII: smile symmetric, eyes kept closed tightly against resistance, facial light touch sensation equal VIII: hearing grossly normal IX, X: symmetric soft palate movement, uvula elevates symmetrically  XI: bilateral shoulder shrug symmetric and strong XII: midline tongue extension 5/5 muscle strength in upper  and lower extremities bilaterally including strong and equal grip strength and dorsiflexion/plantar flexion Sensory to light touch normal in all four extremities.      ED Treatments / Results  Labs (all labs ordered are listed, but only abnormal results are displayed) Labs Reviewed - No data to display  EKG EKG Interpretation  Date/Time:  Monday August 10 2018 19:03:49 EST Ventricular Rate:  63 PR Interval:    QRS Duration: 293 QT Interval:  432 QTC Calculation: 443 R Axis:   1 Text Interpretation:  Poor quality data, interpretation may be affected Sinus rhythm Biatrial enlargement Left ventricular hypertrophy Probable inferior infarct, acute Anterior infarct, old Lateral leads are also involved Artifact in lead(s) I II III aVR aVL aVF V1 V2 V3 V4 V5 V6 Confirmed by Quintella Reichert [phone]) on 08/10/2018 9:25:28 PM   Radiology Dg Lumbar Spine Complete  Result Date: 08/10/2018 CLINICAL DATA:  Low back pain EXAM: LUMBAR SPINE - COMPLETE 4+ VIEW COMPARISON:  07/21/2018 FINDINGS: Multiple calcifications in the right upper quadrant felt consistent with gallstones. Coarse calcifications in the pelvis, likely fibroids. 5 mm anterolisthesis L4 on L5. Vertebral body heights are within normal limits. Mild to moderate degenerative change at L1-L2. Mild degenerative change at L4-L5. Moderate degenerative change at L5-S1. Posterior facet degenerative change of the lower lumbar spine.  IMPRESSION: 1. No acute osseous abnormality. 2. Similar 5 mm anterolisthesis L4 on L5 with multiple level degenerative change 3. Gallstones 4. Probable calcified fibroids Electronically Signed   By: Donavan Foil M.D.   On: 08/10/2018 20:25   Ct Head Wo Contrast  Result Date: 08/10/2018 CLINICAL DATA:  Status post fall. EXAM: CT HEAD WITHOUT CONTRAST CT CERVICAL SPINE WITHOUT CONTRAST TECHNIQUE: Multidetector CT imaging of the head and cervical spine was performed following the standard protocol without intravenous contrast. Multiplanar CT image reconstructions of the cervical spine were also generated. COMPARISON:  Head CT dated 07/21/2018. Head CT and cervical spine dated 10/28/2015. FINDINGS: CT HEAD FINDINGS Brain: Ventricles are stable in size and configuration. Stimulator leads appear stable in position terminating in the lower basal ganglia regions bilaterally. Mild chronic small vessel ischemic change again noted within the bilateral periventricular and subcortical white matter regions. No mass, hemorrhage, edema or other evidence of acute parenchymal abnormality. No extra-axial hemorrhage. Vascular: Chronic calcified atherosclerotic changes of the large vessels at the skull base. No unexpected hyperdense vessel. Skull: No skull fracture or dislocation. Sinuses/Orbits: No acute finding. Other: None. CT CERVICAL SPINE FINDINGS Alignment: Dextroscoliosis of the cervical spine, similar to the previous exam. No evidence of acute vertebral body subluxation. Skull base and vertebrae: No fracture line or displaced fracture fragment appreciated. Facet joints appear normally aligned. Soft tissues and spinal canal: No prevertebral fluid or swelling. No visible canal hematoma. Disc levels: Degenerative spondylosis throughout the mid and lower cervical spine, moderate in degree with associated disc space narrowings and osseous spurring. No more than mild central canal stenosis at any level. Upper chest: No acute  findings. Other: None. IMPRESSION: 1. No acute intracranial abnormality. No intracranial mass, hemorrhage or edema. No skull fracture. Chronic ischemic changes within the white matter. 2. No fracture or acute subluxation within the cervical spine. Degenerative changes of the cervical spine, as detailed above. Electronically Signed   By: Franki Cabot M.D.   On: 08/10/2018 20:00   Ct Cervical Spine Wo Contrast  Result Date: 08/10/2018 CLINICAL DATA:  Status post fall. EXAM: CT HEAD WITHOUT CONTRAST CT CERVICAL SPINE WITHOUT CONTRAST TECHNIQUE: Multidetector CT imaging  of the head and cervical spine was performed following the standard protocol without intravenous contrast. Multiplanar CT image reconstructions of the cervical spine were also generated. COMPARISON:  Head CT dated 07/21/2018. Head CT and cervical spine dated 10/28/2015. FINDINGS: CT HEAD FINDINGS Brain: Ventricles are stable in size and configuration. Stimulator leads appear stable in position terminating in the lower basal ganglia regions bilaterally. Mild chronic small vessel ischemic change again noted within the bilateral periventricular and subcortical white matter regions. No mass, hemorrhage, edema or other evidence of acute parenchymal abnormality. No extra-axial hemorrhage. Vascular: Chronic calcified atherosclerotic changes of the large vessels at the skull base. No unexpected hyperdense vessel. Skull: No skull fracture or dislocation. Sinuses/Orbits: No acute finding. Other: None. CT CERVICAL SPINE FINDINGS Alignment: Dextroscoliosis of the cervical spine, similar to the previous exam. No evidence of acute vertebral body subluxation. Skull base and vertebrae: No fracture line or displaced fracture fragment appreciated. Facet joints appear normally aligned. Soft tissues and spinal canal: No prevertebral fluid or swelling. No visible canal hematoma. Disc levels: Degenerative spondylosis throughout the mid and lower cervical spine, moderate  in degree with associated disc space narrowings and osseous spurring. No more than mild central canal stenosis at any level. Upper chest: No acute findings. Other: None. IMPRESSION: 1. No acute intracranial abnormality. No intracranial mass, hemorrhage or edema. No skull fracture. Chronic ischemic changes within the white matter. 2. No fracture or acute subluxation within the cervical spine. Degenerative changes of the cervical spine, as detailed above. Electronically Signed   By: Franki Cabot M.D.   On: 08/10/2018 20:00   Ct Pelvis Wo Contrast  Result Date: 08/10/2018 CLINICAL DATA:  Right hip pain EXAM: CT PELVIS WITHOUT CONTRAST TECHNIQUE: Multidetector CT imaging of the pelvis was performed following the standard protocol without intravenous contrast. COMPARISON:  Plain film from earlier in the same day. FINDINGS: Urinary Tract:  Bladder is decompressed. Bowel: Mild retained fecal material is noted within the colon consistent with a degree of constipation. No obstructive changes are seen. Vascular/Lymphatic: Mild vascular calcifications are seen. No significant adenopathy is noted. Reproductive: Multiple uterine fibroids are noted both calcified and noncalcified. No adnexal mass is seen. Other:  No free fluid is noted. Musculoskeletal: Degenerative changes of lumbar spine are seen. Degenerative changes of the pubic symphysis are noted. No fracture is identified to correspond with that seen on the prior plain film examination. No soft tissue abnormality is noted. IMPRESSION: No acute fracture noted. Chronic changes as described above. Electronically Signed   By: Inez Catalina M.D.   On: 08/10/2018 22:19   Dg Hip Unilat W Or Wo Pelvis 2-3 Views Right  Result Date: 08/10/2018 CLINICAL DATA:  Fall with hip pain EXAM: DG HIP (WITH OR WITHOUT PELVIS) 2-3V RIGHT COMPARISON:  None. FINDINGS: Coarse calcification in the pelvis, likely a fibroid. Irregularity of the right pubic symphysis with possible lucency. No  acute fracture or malalignment of the right hip. Mild degenerative change. IMPRESSION: 1. Possible acute fracture involving the right pubic symphysis 2. No acute osseous abnormality of the right hip. Mild degenerative change. Electronically Signed   By: Donavan Foil M.D.   On: 08/10/2018 20:26    Procedures Procedures (including critical care time)  Medications Ordered in ED Medications - No data to display   Initial Impression / Assessment and Plan / ED Course  I have reviewed the triage vital signs and the nursing notes.  Pertinent labs & imaging results that were available during my care of  the patient were reviewed by me and considered in my medical decision making (see chart for details).    KORAYMA HAGWOOD is a 68 y.o. female who presents to ED for evaluation after mechanical fall.  No focal neuro deficits.  All 4 extremities are neurovascularly intact.  She does have some tenderness to the right lateral hip and low back.  Initial plain film shows possible acute fracture of the right pubic symphysis.  Underwent CT which was reassuring with no acute findings.  Remainder of imaging without acute injury.  Recommend follow-up with her PCP if symptoms are not improving.  Reasons to return to the ER were discussed and all questions answered.  Patient seen by and discussed with Dr. Ralene Bathe who agrees with treatment plan.   Final Clinical Impressions(s) / ED Diagnoses   Final diagnoses:  Fall, initial encounter    ED Discharge Orders    None       Ward, Ozella Almond, PA-C 08/10/18 2245    Quintella Reichert, MD 08/11/18 1000

## 2020-04-20 NOTE — Progress Notes (Signed)
Left voice message for patient's son Carrie Ramsey to return my call regarding referral we received from Dr. Marlou Sa for his mother to see one of our medical oncologists.  I ask that he call me back on my direct line which was provided.

## 2020-04-20 NOTE — Progress Notes (Signed)
Patient's son returned my call and I have scheduled the patient with Cira Rue NP at 1:45 on 04/27/2020 (Dr. Burr Medico will see as well).  I have emailed him with directions and instructions regarding this appointment per his request.

## 2020-04-26 NOTE — Progress Notes (Deleted)
[place]  [phone] Fax:([phone]) [place] Note   Patient Care Team: Rogers Blocker, MD as PCP - General (Internal Medicine) Jonnie Finner, RN as Oncology Nurse Navigator Truitt Merle, MD as Consulting Physician (Oncology) Alla Feeling, NP as Nurse Practitioner (Nurse Practitioner) 04/26/2020  CHIEF COMPLAINTS/PURPOSE OF CONSULTATION:  Hepatocellular carcinoma  HISTORY OF PRESENTING ILLNESS:  Carrie Ramsey 69 y.o. female with h/o treated Hep C, cirrhosis and hepatocellular carcinoma in 2016 initially with multiple solid masses throught the liver on 06/06/15 abd [place]. MRI abdomen on 06/13/15 showed 3x6 x2.8 cm lesion in seg 4 with mass effect on hepatic capsule and a 3.2 x3.1 cm lesion in seg 6 suspicious for HCC and 2 other lesions, one in seg 3 and seg 8 less suspicious for [place]. AFP markedly elevated to 4640 at that time. She was not a resection or transplant candidate and underwent TACE on 07/27/15. Surveillance scan in 11/2015 showed viable disease in segment 6 lesion. Due to other co-morbidities such as parkinson's patient deferred additional treatment at that time and instead pursued treatment for HCV. AFP improved to 36 in 01/2016. MRI in 03/2016 showed treatment response and interval decrease in size of ablated seg 6 lesion and stable ablation effect in seg 4, she remained stable on observation with surveillance imaging through the end of 2018 until MRI 04/18/2017 showed near the TACE site an adjacent focus measuring 1.1; AFP increased to 70. Due to progressing parkinson's and need for surgery she opted to continue surveillance.  When she finally returned to oncology in 01/2018 she had evidence of disease progression in the previously treated segment 4 lesion, a new LR-4 lesion 0.9 cm in segment 8 and an indeterminate LR 3 lesion in seg 3 measuring 0.8 cm. She underwent TACE on 03/19/20 to 2 lesions in seg 8 and 4. CT on 05/15/2018 showed stable  viable 3.5 cm lesion in segment 4, treated lesions in segments 6 and 8, and unchanged LR-3 lesion in segment 3, AFP up to 171.  She subsequently underwent basal ganglia stimulator in 2020 for Parkinson's and developed seizure and hemorrhagic stroke, lost to follow-up while residing at [facility].  She was brought in by her son to see PCP Dr. Marlou Sa for mental status evaluation and was subsequently referred back to oncology.  MEDICAL HISTORY:  Past Medical History:  Diagnosis Date  . Anxiety   . Arthritis of left hand   . Hay fever   . Hypertension   . TIA (transient ischemic attack) 2012   Patient reported, did not seek physician help    SURGICAL HISTORY: Past Surgical History:  Procedure Laterality Date  . APPENDECTOMY  1973    SOCIAL HISTORY: Social History   Socioeconomic History  . Marital status: Single    Spouse name: Not on file  . Number of children: Not on file  . Years of education: Not on file  . Highest education level: Not on file  Occupational History  . Occupation: Art gallery manager  Tobacco Use  . Smoking status: Former Smoker  Substance and Sexual Activity  . Alcohol use: No  . Drug use: Not on file  . Sexual activity: Not on file  Other Topics Concern  . Not on file  Social History Narrative  . Not on file   Social Determinants of Health   Financial Resource Strain:   . Difficulty of Paying Living Expenses: Not on file  Food Insecurity:   . Worried  About Running Out of Food in the Last Year: Not on file  . Ran Out of Food in the Last Year: Not on file  Transportation Needs:   . Lack of Transportation (Medical): Not on file  . Lack of Transportation (Non-Medical): Not on file  Physical Activity:   . Days of Exercise per Week: Not on file  . Minutes of Exercise per Session: Not on file  Stress:   . Feeling of Stress : Not on file  Social Connections:   . Frequency of Communication with Friends and Family: Not on file  . Frequency of Social Gatherings  with Friends and Family: Not on file  . Attends Religious Services: Not on file  . Active Member of Clubs or Organizations: Not on file  . Attends Archivist Meetings: Not on file  . Marital Status: Not on file  Intimate Partner Violence:   . Fear of Current or Ex-Partner: Not on file  . Emotionally Abused: Not on file  . Physically Abused: Not on file  . Sexually Abused: Not on file    FAMILY HISTORY: Family History  Problem Relation Age of Onset  . Aneurysm Brother   . Diabetes Mother     ALLERGIES:  is allergic to penicillins and sulfa antibiotics.  MEDICATIONS:  Current Outpatient Medications  Medication Sig Dispense Refill  . acetaminophen (TYLENOL) 500 MG tablet Take 500 mg by mouth every 6 (six) hours as needed for headache (do not exceed more than 3000mg  in 24 hours).    . Ascorbic Acid (VITAMIN C) 500 MG CAPS Take 500 mg by mouth daily. 60 capsule   . calcium carbonate (TUMS - DOSED IN MG ELEMENTAL CALCIUM) 500 MG chewable tablet Chew 1 tablet by mouth 3 (three) times daily as needed for indigestion or heartburn.    . Carbidopa-Levodopa ER (SINEMET CR) 25-100 MG tablet controlled release Take 1 tablet by mouth 4 (four) times daily.   0  . carboxymethylcellulose (REFRESH PLUS) 0.5 % SOLN Place 1 drop into both eyes 4 (four) times daily. Wait 3-5 minutes before administering another eye drop    . lactulose (CHRONULAC) 10 GM/15ML solution Take 50 g by mouth 4 (four) times daily.    Marland Kitchen levETIRAcetam (KEPPRA) 500 MG tablet Take 500 mg by mouth 2 (two) times daily.    Marland Kitchen LORazepam (ATIVAN) 0.5 MG tablet Take 0.25 mg by mouth every 12 (twelve) hours as needed for anxiety.    . magnesium oxide (MAG-OX) 400 MG tablet Take 400 mg by mouth 2 (two) times daily.    . Melatonin 3 MG TABS Take 3 mg by mouth at bedtime.    . methocarbamol (ROBAXIN) 500 MG tablet Take 1 tablet (500 mg total) by mouth 2 (two) times daily. (Patient not taking: Reported on 07/21/2018) 20 tablet 0  .  metoprolol succinate (TOPROL-XL) 25 MG 24 hr tablet Take 25 mg by mouth daily.    . ondansetron (ZOFRAN) 4 MG tablet Take 4 mg by mouth every 4 (four) hours as needed for nausea.    . rifaximin (XIFAXAN) 550 MG TABS tablet Take 550 mg by mouth 2 (two) times daily.    . sodium chloride (OCEAN) 0.65 % SOLN nasal spray Place 1 spray into both nostrils every 4 (four) hours as needed for congestion.     No current facility-administered medications for this visit.    REVIEW OF SYSTEMS:   Constitutional: Denies fevers, chills or abnormal night sweats Eyes: Denies blurriness of vision, double  vision or watery eyes Ears, nose, mouth, throat, and face: Denies mucositis or sore throat Respiratory: Denies cough, dyspnea or wheezes Cardiovascular: Denies palpitation, chest discomfort or lower extremity swelling Gastrointestinal:  Denies nausea, heartburn or change in bowel habits Skin: Denies abnormal skin rashes Lymphatics: Denies new lymphadenopathy or easy bruising Neurological:Denies numbness, tingling or new weaknesses Behavioral/Psych: Mood is stable, no new changes  All other systems were reviewed with the patient and are negative.  PHYSICAL EXAMINATION: ECOG PERFORMANCE STATUS: {CHL ONC ECOG PS:[id number]}  There were no vitals filed for this visit. There were no vitals filed for this visit.  GENERAL:alert, no distress and comfortable SKIN: skin color, texture, turgor are normal, no rashes or significant lesions EYES: normal, conjunctiva are pink and non-injected, sclera clear OROPHARYNX:no exudate, no erythema and lips, buccal mucosa, and tongue normal  NECK: supple, thyroid normal size, non-tender, without nodularity LYMPH:  no palpable lymphadenopathy in the cervical, axillary or inguinal LUNGS: clear to auscultation and percussion with normal breathing effort HEART: regular rate & rhythm and no murmurs and no lower extremity edema ABDOMEN:abdomen soft, non-tender and normal bowel  sounds Musculoskeletal:no cyanosis of digits and no clubbing  PSYCH: alert & oriented x 3 with fluent speech NEURO: no focal motor/sensory deficits  LABORATORY DATA:  I have reviewed the data as listed CBC Latest Ref Rng & Units 07/21/2018 08/26/2011  WBC 4.0 - 10.5 K/uL 6.4 4.8  Hemoglobin 12.0 - 15.0 g/dL 14.6 14.9  Hematocrit 36 - 46 % 44.0 45.0  Platelets 150 - 400 K/uL 124(L) 99(L)    @cmpl @  RADIOGRAPHIC STUDIES: I have personally reviewed the radiological images as listed and agreed with the findings in the report. No results found.  ASSESSMENT & PLAN:  *** No orders of the defined types were placed in this encounter.   All questions were answered. The patient knows to call the clinic with any problems, questions or concerns. I spent {CHL ONC TIME VISIT - PJKDT:[id number]} counseling the patient face to face. The total time spent in the appointment was {CHL ONC TIME VISIT - XIPJA:[id number]} and more than 50% was on counseling.     Alla Feeling, NP 04/26/2020 5:01 PM

## 2020-04-27 ENCOUNTER — Telehealth: Payer: Self-pay | Admitting: Nurse Practitioner

## 2020-04-27 ENCOUNTER — Inpatient Hospital Stay: Payer: Medicare Other | Admitting: Nurse Practitioner

## 2020-04-27 NOTE — Telephone Encounter (Signed)
Pt's son cld to reschedule Ms. Six appt to 11/3 at 145pm to see Lacie.

## 2020-05-02 NOTE — Progress Notes (Signed)
Left voice message for patient's son to call me back on my direct line as we need to reschedule her appointment from tomorrow 11/3 to either 11/8 at 1:45 or 11/10 at 1:45.

## 2020-05-03 ENCOUNTER — Inpatient Hospital Stay: Payer: Medicare Other | Admitting: Nurse Practitioner

## 2020-05-09 NOTE — Progress Notes (Addendum)
[place]  Telephone:(336) [phone] Fax:(336) [place] Note   Patient Care Team: Rogers Blocker, MD as PCP - General (Internal Medicine) Jonnie Finner, RN as Oncology Nurse Navigator Truitt Merle, MD as Consulting Physician (Oncology) Alla Feeling, NP as Nurse Practitioner (Nurse Practitioner) Date of Service: 05/10/2020   CHIEF COMPLAINTS/PURPOSE OF CONSULTATION:  History of hepatocellular carcinoma, referred by PCP Dr. Kevan Ny    Oncology History  Hepatocellular carcinoma ([place])  06/2015 Imaging   2 lesions on MRI suspicious for HCC, one measuring 3.6 cm in segment 4A and 3.2 cm in segment 6.   06/2015 Tumor Marker   AFP 4640   07/2015 Procedure   S/p TACE to [facility] lesions in seg 4A and 6   08/2015 Tumor Marker   AFP 55   03/2017 Imaging   persistent appearance of viable tumor in the lesion in segment 4A which mildly progressed in 03/2017 and with new evidence of LR-4 lesion in segment 8  AFP increased to 70  Continued surveillance    03/19/2018 Procedure   02/20/2018 imaging showed further progression and underwent TACE on 03/19/2018 of segment 4A lesion and segment 8 lesion   05/15/2020 Initial Diagnosis   Hepatocellular carcinoma ([place])    HISTORY OF PRESENTING ILLNESS:  Carrie Ramsey 69 y.o. female with history of Parkinson's disease and HCV cirrhosis is here as transfer of care from [facility] Dr. Shirleen Schirmer.  She was diagnosed with HCV in 2016, s/p 12 weeks Epclusa. After getting medical insurance she began [facility] screening in 06/2015, found to have elevated AFP 4640 and 2 lesions on MRI suspicious for HCC, one measuring 3.6 cm in segment 4A and 3.2 cm in segment 6. She was not felt to be a transplant or resection candidate and was treated with TACE. AFP improved to 55 in 08/2015 after treatment; follow-up imaging remained stable with persistent appearance of viable tumor in the lesion in segment 4A which mildly progressed in 03/2017 and  with new evidence of LR-4 lesion in segment 8; AFP increased to 70.  She was doing well, asymptomatic from [facility] but dealing with progressive Parkinson's disease so she preferred surveillance.  On imaging 02/20/2018 she had further progression and underwent TACE on 03/19/2018 of segment 4A lesion and segment 8 lesion. AFP elevated to 171 in 05/2018.  She underwent DBS for Parkinson's and had subsequent seizures requiring hospitalization and 73-month stay in rehab after which transportation and mobility became problematic and she was lost to heme-onc follow-up after that.  Socially, she is from [place], has 2 children, lives with her son Hart Carwin who is her primary caregiver.  She previously worked in business.  She has a Marine scientist aide who comes once daily to help with ADLs, has a Civil Service fast streamer at home.  She cannot prepare her own meals but can feed herself, similarly can walk to the bathroom but cannot clean herself.  She denies family history of cancer.  She smoked cigarettes 20 years ago, denies alcohol or drug use.  Today she presents with her son Hart Carwin in a wheelchair.  Her appetite is normal, weight is stable.  Denies abdominal pain, bloating, leg edema.  Bowels are loose on lactulose at least once daily.  No signs of jaundice, bleeding, fever, chills, cough, chest pain, dyspnea.  No recent seizures, mental status at baseline per Hart Carwin.   MEDICAL HISTORY:  Past Medical History:  Diagnosis Date   Anxiety    Arthritis of left  hand    Hay fever    Hypertension    TIA (transient ischemic attack) 2012   Patient reported, did not seek physician help    SURGICAL HISTORY: Past Surgical History:  Procedure Laterality Date   APPENDECTOMY  1973    SOCIAL HISTORY: Social History   Socioeconomic History   Marital status: Single    Spouse name: Not on file   Number of children: Not on file   Years of education: Not on file   Highest education level: Not on file  Occupational History    Occupation: Art gallery manager  Tobacco Use   Smoking status: Former Smoker  Substance and Sexual Activity   Alcohol use: No   Drug use: Not on file   Sexual activity: Not on file  Other Topics Concern   Not on file  Social History Narrative   Not on file   Social Determinants of Health   Financial Resource Strain:    Difficulty of Paying Living Expenses: Not on file  Food Insecurity:    Worried About Charity fundraiser in the Last Year: Not on file   [employer] of Food in the Last Year: Not on file  Transportation Needs:    Lack of Transportation (Medical): Not on file   Lack of Transportation (Non-Medical): Not on file  Physical Activity:    Days of Exercise per Week: Not on file   Minutes of Exercise per Session: Not on file  Stress:    Feeling of Stress : Not on file  Social Connections:    Frequency of Communication with Friends and Family: Not on file   Frequency of Social Gatherings with Friends and Family: Not on file   Attends Religious Services: Not on file   Active Member of Clubs or Organizations: Not on file   Attends Archivist Meetings: Not on file   Marital Status: Not on file  Intimate Partner Violence:    Fear of Current or Ex-Partner: Not on file   Emotionally Abused: Not on file   Physically Abused: Not on file   Sexually Abused: Not on file    FAMILY HISTORY: Family History  Problem Relation Age of Onset   Aneurysm Brother    Diabetes Mother     ALLERGIES:  is allergic to penicillins and sulfa antibiotics.  MEDICATIONS:  Current Outpatient Medications  Medication Sig Dispense Refill   acetaminophen (TYLENOL) 500 MG tablet Take 500 mg by mouth every 6 (six) hours as needed for headache (do not exceed more than 3000mg  in 24 hours).     Ascorbic Acid (VITAMIN C) 500 MG CAPS Take 500 mg by mouth daily. 60 capsule    calcium carbonate (TUMS - DOSED IN MG ELEMENTAL CALCIUM) 500 MG chewable tablet Chew 1 tablet by  mouth 3 (three) times daily as needed for indigestion or heartburn.     Carbidopa-Levodopa ER (SINEMET CR) 25-100 MG tablet controlled release Take 1 tablet by mouth 4 (four) times daily.   0   carboxymethylcellulose (REFRESH PLUS) 0.5 % SOLN Place 1 drop into both eyes 4 (four) times daily. Wait 3-5 minutes before administering another eye drop     lactulose (CHRONULAC) 10 GM/15ML solution Take 50 g by mouth 4 (four) times daily.     levETIRAcetam (KEPPRA) 500 MG tablet Take 500 mg by mouth 2 (two) times daily.     LORazepam (ATIVAN) 0.5 MG tablet Take 0.25 mg by mouth every 12 (twelve) hours as needed for anxiety.  magnesium oxide (MAG-OX) 400 MG tablet Take 400 mg by mouth 2 (two) times daily.     Melatonin 3 MG TABS Take 3 mg by mouth at bedtime.     methocarbamol (ROBAXIN) 500 MG tablet Take 1 tablet (500 mg total) by mouth 2 (two) times daily. (Patient not taking: Reported on 07/21/2018) 20 tablet 0   metoprolol succinate (TOPROL-XL) 25 MG 24 hr tablet Take 25 mg by mouth daily.     ondansetron (ZOFRAN) 4 MG tablet Take 4 mg by mouth every 4 (four) hours as needed for nausea.     rifaximin (XIFAXAN) 550 MG TABS tablet Take 550 mg by mouth 2 (two) times daily.     sodium chloride (OCEAN) 0.65 % SOLN nasal spray Place 1 spray into both nostrils every 4 (four) hours as needed for congestion.     No current facility-administered medications for this visit.    REVIEW OF SYSTEMS:   Constitutional: Denies fevers, chills or abnormal night sweats Eyes: Denies blurriness of vision, double vision or watery eyes Ears, nose, mouth, throat, and face: Denies mucositis or sore throat Respiratory: Denies cough, dyspnea or wheezes Cardiovascular: Denies palpitation, chest discomfort or lower extremity swelling Gastrointestinal:  Denies nausea, vomiting, diarrhea, bleeding, pain, bloating heartburn or change in bowel habits (+) loose stools on lactulose Skin: Denies abnormal skin  rashes Lymphatics: Denies new lymphadenopathy or easy bruising Neurological:Denies numbness, tingling or new weaknesses (+) generalized weakness (+) dependent with ADLs (+) Parkinson's disease  Behavioral/Psych: Mood is stable, no new changes  All other systems were reviewed with the patient and are negative.  PHYSICAL EXAMINATION: ECOG PERFORMANCE STATUS: 3 - Symptomatic, >50% confined to bed  Vitals:   05/10/20 1401  BP: 124/76  Pulse: (!) 59  Resp: 16  Temp: 98.7 F (37.1 C)  SpO2: 100%   Filed Weights   05/10/20 1401  Weight: 140 lb 1.6 oz (63.5 kg)    GENERAL:alert, no distress and comfortable SKIN: Dry, no rash EYES: sclera clear LYMPH:  no palpable cervical or supraclavicular lymphadenopathy  LUNGS: clear with normal breathing effort HEART: regular rate & rhythm and no murmurs and no lower extremity edema.  DBS apparatus in the left upper chest ABDOMEN:abdomen soft, non-tender and normal bowel sounds.  No palpable hepatomegaly or mass.  Exam limited by body habitus in wheelchair position PSYCH: alert & oriented x 3 with fluent speech NEURO: Generalized weakness  LABORATORY DATA:  I have reviewed the data as listed CBC Latest Ref Rng & Units 05/10/2020 07/21/2018 08/26/2011  WBC 4.0 - 10.5 K/uL 6.1 6.4 4.8  Hemoglobin 12.0 - 15.0 g/dL 15.4(H) 14.6 14.9  Hematocrit 36 - 46 % 45.5 44.0 45.0  Platelets 150 - 400 K/uL 145(L) 124(L) 99(L)   CMP Latest Ref Rng & Units 05/10/2020 07/21/2018  Glucose 70 - 99 mg/dL 85 102(H)  BUN 8 - 23 mg/dL 16 10  Creatinine 0.44 - 1.00 mg/dL 0.76 0.70  Sodium 135 - 145 mmol/L 140 138  Potassium 3.5 - 5.1 mmol/L 3.9 3.7  Chloride 98 - 111 mmol/L 109 109  CO2 22 - 32 mmol/L 24 23  Calcium 8.9 - 10.3 mg/dL 9.0 8.9  Total Protein 6.5 - 8.1 g/dL 6.8 6.2(L)  Total Bilirubin 0.3 - 1.2 mg/dL 0.9 1.5(H)  Alkaline Phos 38 - 126 U/L 135(H) 128(H)  AST 15 - 41 U/L 33 61(H)  ALT 0 - 44 U/L 7 61(H)    RADIOGRAPHIC STUDIES: I have personally  reviewed the radiological images  as listed and agreed with the findings in the report. No results found.  ASSESSMENT & PLAN: 69 year old female with history of multifocal HCC and Parkinson's disease  1.  Multifocal hepatocellular carcinoma -We reviewed her outside medical record in detail with the patient and her son.  She was found to have multifocal [place] in segment 4A and 6 in 2016 when she began [facility] screening secondary to HCV cirrhosis, AFP 4640 at diagnosis -She underwent TACE at [facility] in 07/2015 with excellent partial response, and again to segment 4A and new [place] lesion in segment 8 and 03/2018.  -She was lost to follow-up after that due to progressive Parkinson's disease and deconditioning, limited mobility and transportation difficulties -She lives with her son now, she is interested in knowing the status of her [facility] and continued monitoring -New baseline AFP markedly elevated 7206 which is concerning for disease progression however she is completely asymptomatic, no pain, jaundice, ascites.  -We will obtain CT AP with contrast for new baseline.  Her son has concerns about her capacity to tolerate anesthesia or liver directed therapy.  Briefly discussed systemic options with TKIs, immunotherapy, biological therapy.  We reviewed that treatment will be challenging given her physical deconditioning and transportation limitations but she is interested in her options.  -We will call her after CT to discuss her options.  2.  H/o HCV cirrhosis -Diagnosed in 2016, s/p 12 weeks of Epclusa per GI at [facility] -She does not see a liver specialist or GI yet in [place], her son will let us know if she wants a referral  3.  Parkinson's disease -Progressed, s/p DBS -Has not seen neurology recently, managed by PCP -The patient's son, Merrilee Seashore, is very involved with her care, she lives with him, and they also have home health assistance daily to help with ADLs  PLAN: -Outside records reviewed  -Labs and CT for new  baseline, will call with results to discuss how to proceed   Orders Placed This Encounter  Procedures   CT Abdomen Pelvis W Contrast    Standing Status:   Future    Standing Expiration Date:   05/10/2021    Order Specific Question:   If indicated for the ordered procedure, I authorize the administration of contrast media per Radiology protocol    Answer:   Yes    Order Specific Question:   Preferred imaging location?    Answer:   [facility]    Order Specific Question:   Is Oral Contrast requested for this exam?    Answer:   Yes, Per Radiology protocol   CBC with Differential ([place] Only)    Standing Status:   Standing    Number of Occurrences:   20    Standing Expiration Date:   05/10/2021   CMP ([place] only)    Standing Status:   Standing    Number of Occurrences:   20    Standing Expiration Date:   05/10/2021   AFP tumor marker    Standing Status:   Standing    Number of Occurrences:   20    Standing Expiration Date:   05/10/2021   Protime-INR    Standing Status:   Future    Number of Occurrences:   1    Standing Expiration Date:   05/10/2021    All questions were answered. The patient knows to call the clinic with any problems, questions or concerns.     Alla Feeling, NP 05/10/2020  Addendum  I have seen the patient, examined her. I agree with the assessment and and plan and have edited the notes.    I have reviewed her outside medical records. She was diagnosed with multifocal [place] in December 2016, status post TACE at [facility], with excellent partial response. Her last CT scanning was done in November 2019 which showed probable disease at the previous treated lesion. She has lost follow-up for [facility] since then due to her severe Parkinson's disease. She is not symptomatic from liver cancer standpoint, however patient and her son wish to be monitor for [facility], although they understand that she may not be able to tolerate more [place] treatment due to her  medical comorbidities and deconditioning. We reviewed systemic treatment options for HCC, including immunotherapy, bevacizumab, and the TKI's. May not start any treatment until she has significant progression or became symptomatic from [facility]. We will proceed lab work, including AFP, and get a CT and pelvis with contrast (liver protocol) in the near future for evaluation her [place] status.   Truitt Merle 05/10/2020 05/10/2020

## 2020-05-10 ENCOUNTER — Inpatient Hospital Stay: Payer: Medicare Other | Attending: Nurse Practitioner | Admitting: Nurse Practitioner

## 2020-05-10 ENCOUNTER — Inpatient Hospital Stay: Payer: Medicare Other

## 2020-05-10 ENCOUNTER — Other Ambulatory Visit: Payer: Self-pay

## 2020-05-10 VITALS — BP 124/76 | HR 59 | Temp 98.7°F | Resp 16 | Ht 66.0 in | Wt 140.1 lb

## 2020-05-10 DIAGNOSIS — M19042 Primary osteoarthritis, left hand: Secondary | ICD-10-CM | POA: Insufficient documentation

## 2020-05-10 DIAGNOSIS — C220 Liver cell carcinoma: Secondary | ICD-10-CM

## 2020-05-10 DIAGNOSIS — Z79899 Other long term (current) drug therapy: Secondary | ICD-10-CM | POA: Insufficient documentation

## 2020-05-10 DIAGNOSIS — Z8673 Personal history of transient ischemic attack (TIA), and cerebral infarction without residual deficits: Secondary | ICD-10-CM | POA: Insufficient documentation

## 2020-05-10 DIAGNOSIS — Z87891 Personal history of nicotine dependence: Secondary | ICD-10-CM | POA: Insufficient documentation

## 2020-05-10 DIAGNOSIS — G20 Parkinson's disease: Secondary | ICD-10-CM | POA: Insufficient documentation

## 2020-05-10 DIAGNOSIS — I10 Essential (primary) hypertension: Secondary | ICD-10-CM | POA: Diagnosis not present

## 2020-05-10 LAB — CMP (CANCER CENTER ONLY)
ALT: 7 U/L (ref 0–44)
AST: 33 U/L (ref 15–41)
Albumin: 3.3 g/dL — ABNORMAL LOW (ref 3.5–5.0)
Alkaline Phosphatase: 135 U/L — ABNORMAL HIGH (ref 38–126)
Anion gap: 7 (ref 5–15)
BUN: 16 mg/dL (ref 8–23)
CO2: 24 mmol/L (ref 22–32)
Calcium: 9 mg/dL (ref 8.9–10.3)
Chloride: 109 mmol/L (ref 98–111)
Creatinine: 0.76 mg/dL (ref 0.44–1.00)
GFR, Estimated: >60 mL/min (ref >60)
Glucose, Bld: 85 mg/dL (ref 70–99)
Potassium: 3.9 mmol/L (ref 3.5–5.1)
Sodium: 140 mmol/L (ref 135–145)
Total Bilirubin: 0.9 mg/dL (ref 0.3–1.2)
Total Protein: 6.8 g/dL (ref 6.5–8.1)

## 2020-05-10 LAB — PROTIME-INR
INR: 1.1 (ref 0.8–1.2)
Prothrombin Time: 13.4 seconds (ref 11.4–15.2)

## 2020-05-10 LAB — CBC WITH DIFFERENTIAL (CANCER CENTER ONLY)
Abs Immature Granulocytes: 0.02 10*3/uL (ref 0.00–0.07)
Basophils Absolute: 0.1 10*3/uL (ref 0.0–0.1)
Basophils Relative: 1 %
Eosinophils Absolute: 0.3 10*3/uL (ref 0.0–0.5)
Eosinophils Relative: 6 %
HCT: 45.5 % (ref 36.0–46.0)
Hemoglobin: 15.4 g/dL — ABNORMAL HIGH (ref 12.0–15.0)
Immature Granulocytes: 0 %
Lymphocytes Relative: 27 %
Lymphs Abs: 1.7 10*3/uL (ref 0.7–4.0)
MCH: 30.7 pg (ref 26.0–34.0)
MCHC: 33.8 g/dL (ref 30.0–36.0)
MCV: 90.8 fL (ref 80.0–100.0)
Monocytes Absolute: 0.5 10*3/uL (ref 0.1–1.0)
Monocytes Relative: 8 %
Neutro Abs: 3.5 10*3/uL (ref 1.7–7.7)
Neutrophils Relative %: 58 %
Platelet Count: 145 10*3/uL — ABNORMAL LOW (ref 150–400)
RBC: 5.01 MIL/uL (ref 3.87–5.11)
RDW: 13.4 % (ref 11.5–15.5)
WBC Count: 6.1 10*3/uL (ref 4.0–10.5)
nRBC: 0 % (ref 0.0–0.2)

## 2020-05-11 LAB — AFP TUMOR MARKER: AFP, Serum, Tumor Marker: 7206 ng/mL — ABNORMAL HIGH (ref 0.0–8.3)

## 2020-05-15 ENCOUNTER — Encounter: Payer: Self-pay | Admitting: Nurse Practitioner

## 2020-05-15 DIAGNOSIS — C220 Liver cell carcinoma: Secondary | ICD-10-CM | POA: Insufficient documentation

## 2020-05-15 NOTE — Addendum Note (Signed)
Addended by: Truitt Merle on: 05/15/2020 03:36 PM   Modules accepted: Level of Service

## 2020-05-22 ENCOUNTER — Ambulatory Visit (HOSPITAL_COMMUNITY): Payer: Medicare Other

## 2020-05-23 ENCOUNTER — Telehealth: Payer: Self-pay

## 2020-05-23 NOTE — Telephone Encounter (Signed)
Spoke with son updated on results confirms appt for 12/6 and understands virtual visit for 12/9

## 2020-06-05 ENCOUNTER — Other Ambulatory Visit: Payer: Self-pay

## 2020-06-05 ENCOUNTER — Ambulatory Visit (HOSPITAL_COMMUNITY)
Admission: RE | Admit: 2020-06-05 | Discharge: 2020-06-05 | Disposition: A | Discharge status: Home / Self Care | Payer: Medicare Other | Source: Ambulatory Visit | Attending: Nurse Practitioner | Admitting: Nurse Practitioner

## 2020-06-05 ENCOUNTER — Encounter (HOSPITAL_COMMUNITY): Payer: Self-pay

## 2020-06-05 DIAGNOSIS — C220 Liver cell carcinoma: Secondary | ICD-10-CM | POA: Diagnosis present

## 2020-06-05 MED ORDER — IOHEXOL 300 MG/ML  SOLN
100.0000 mL | Freq: Once | INTRAMUSCULAR | Status: AC | PRN
  Administered 2020-06-05: 100 mL via INTRAVENOUS

## 2020-06-06 ENCOUNTER — Ambulatory Visit
Admission: RE | Admit: 2020-06-06 | Discharge: 2020-06-06 | Disposition: A | Discharge status: Home / Self Care | Payer: Self-pay | Source: Ambulatory Visit | Attending: Diagnostic Radiology | Admitting: Diagnostic Radiology

## 2020-06-06 ENCOUNTER — Other Ambulatory Visit (HOSPITAL_COMMUNITY): Payer: Self-pay | Admitting: Diagnostic Radiology

## 2020-06-06 DIAGNOSIS — C249 Malignant neoplasm of biliary tract, unspecified: Secondary | ICD-10-CM

## 2020-06-07 NOTE — Progress Notes (Signed)
[place]   Telephone:(336) [phone] Fax:(336) [phone]   Clinic Follow up Note   Patient Care Team: Rogers Blocker, MD as PCP - General (Internal Medicine) Jonnie Finner, RN as Oncology Nurse Navigator Truitt Merle, MD as Consulting Physician (Oncology) Alla Feeling, NP as Nurse Practitioner (Nurse Practitioner)   I connected with Carrie Ramsey on 06/09/2020 at 11:40 AM EST by telephone visit and verified that I am speaking with the correct person using two identifiers.  I discussed the limitations, risks, security and privacy concerns of performing an evaluation and management service by telephone and the availability of in person appointments. I also discussed with the patient that there may be a patient responsible charge related to this service. The patient expressed understanding and agreed to proceed.   Other persons participating in the visit and their role in the encounter:  Her son  Patient's location:  Her home Provider's location:  My Office  CHIEF COMPLAINT: F/u of hepatocellular carcinoma  SUMMARY OF ONCOLOGIC HISTORY: Oncology History  Hepatocellular carcinoma ([place])  06/2015 Imaging   2 lesions on MRI suspicious for HCC, one measuring 3.6 cm in segment 4A and 3.2 cm in segment 6.   06/2015 Tumor Marker   AFP 4640   07/2015 Procedure   S/p TACE to [facility] lesions in seg 4A and 6   08/2015 Tumor Marker   AFP 55   03/2017 Imaging   persistent appearance of viable tumor in the lesion in segment 4A which mildly progressed in 03/2017 and with new evidence of LR-4 lesion in segment 8  AFP increased to 70  Continued surveillance    03/19/2018 Procedure   02/20/2018 imaging showed further progression and underwent TACE on 03/19/2018 of segment 4A lesion and segment 8 lesion   05/10/2020 Tumor Marker   AFP at 7206   05/15/2020 Initial Diagnosis   Hepatocellular carcinoma ([place])   06/05/2020 Imaging   CT AP  IMPRESSION: 1. Cirrhotic liver with  evidence of portal hypertension. 2. Several arterial phase enhancing lesions in liver and multiple lesions which demonstrate washout. Findings are highly concerning for multifocal hepatocellular carcinoma 3. Large round mass within the pelvis adjacent to the uterus. 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 Consider MRI of the pelvis further characterization as warranted. Lesion increased mildly in size from 2020 favored benign process.        CURRENT THERAPY:  Observation   INTERVAL HISTORY:  Carrie Ramsey is here for a follow up. Her son notes she recently had a bad night last night. She was having to go to bathroom every 30 minutes late at night at first then got up to get up and not talking. He notes she has gotten confused later last night.  She has not complained of dysuria or fever. She is currently with home health care nurse who help watches her during the night.    REVIEW OF SYSTEMS:   Constitutional: Denies fevers, chills or abnormal weight loss Eyes: Denies blurriness of vision Ears, nose, mouth, throat, and face: Denies mucositis or sore throat Respiratory: Denies cough, dyspnea or wheezes UA: (+) Polyuria  Cardiovascular: Denies palpitation, chest discomfort or lower extremity swelling Gastrointestinal:  Denies nausea, heartburn or change in bowel habits Skin: Denies abnormal skin rashes Lymphatics: Denies new lymphadenopathy or easy bruising Neurological:Denies numbness, tingling or new weaknesses (+) increased intermittent confusion Behavioral/Psych: Mood is stable, no  new changes  All other systems were reviewed with the patient and are negative.  MEDICAL HISTORY:  Past Medical History:  Diagnosis Date  . Anxiety   . Arthritis of left hand   . Hay fever   . Hypertension   . TIA (transient ischemic attack) 2012   Patient reported, did not seek physician help     SURGICAL HISTORY: Past Surgical History:  Procedure Laterality Date  . APPENDECTOMY  1973    I have reviewed the social history and family history with the patient and they are unchanged from previous note.  ALLERGIES:  is allergic to penicillins and sulfa antibiotics.  MEDICATIONS:  Current Outpatient Medications  Medication Sig Dispense Refill  . acetaminophen (TYLENOL) 500 MG tablet Take 500 mg by mouth every 6 (six) hours as needed for headache (do not exceed more than 3000mg  in 24 hours).    . Ascorbic Acid (VITAMIN C) 500 MG CAPS Take 500 mg by mouth daily. 60 capsule   . calcium carbonate (TUMS - DOSED IN MG ELEMENTAL CALCIUM) 500 MG chewable tablet Chew 1 tablet by mouth 3 (three) times daily as needed for indigestion or heartburn.    . Carbidopa-Levodopa ER (SINEMET CR) 25-100 MG tablet controlled release Take 1 tablet by mouth 4 (four) times daily.   0  . carboxymethylcellulose (REFRESH PLUS) 0.5 % SOLN Place 1 drop into both eyes 4 (four) times daily. Wait 3-5 minutes before administering another eye drop    . lactulose (CHRONULAC) 10 GM/15ML solution Take 50 g by mouth 4 (four) times daily.    Marland Kitchen levETIRAcetam (KEPPRA) 500 MG tablet Take 500 mg by mouth 2 (two) times daily.    Marland Kitchen LORazepam (ATIVAN) 0.5 MG tablet Take 0.25 mg by mouth every 12 (twelve) hours as needed for anxiety.    . magnesium oxide (MAG-OX) 400 MG tablet Take 400 mg by mouth 2 (two) times daily.    . Melatonin 3 MG TABS Take 3 mg by mouth at bedtime.    . methocarbamol (ROBAXIN) 500 MG tablet Take 1 tablet (500 mg total) by mouth 2 (two) times daily. (Patient not taking: Reported on 07/21/2018) 20 tablet 0  . metoprolol succinate (TOPROL-XL) 25 MG 24 hr tablet Take 25 mg by mouth daily.    . ondansetron (ZOFRAN) 4 MG tablet Take 4 mg by mouth every 4 (four) hours as needed for nausea.    . rifaximin (XIFAXAN) 550 MG TABS tablet Take 550 mg by mouth 2 (two) times daily.    . sodium chloride (OCEAN) 0.65 %  SOLN nasal spray Place 1 spray into both nostrils every 4 (four) hours as needed for congestion.     No current facility-administered medications for this visit.    PHYSICAL EXAMINATION: ECOG PERFORMANCE STATUS: 3 - Symptomatic, >50% confined to bed  No vitals taken today, Exam not performed today   LABORATORY DATA:  I have reviewed the data as listed CBC Latest Ref Rng & Units 05/10/2020 07/21/2018 08/26/2011  WBC 4.0 - 10.5 K/uL 6.1 6.4 4.8  Hemoglobin 12.0 - 15.0 g/dL 15.4(H) 14.6 14.9  Hematocrit 36.0 - 46.0 % 45.5 44.0 45.0  Platelets 150 - 400 K/uL 145(L) 124(L) 99(L)     CMP Latest Ref Rng & Units 05/10/2020 07/21/2018  Glucose 70 - 99 mg/dL 85 102(H)  BUN 8 - 23 mg/dL 16 10  Creatinine 0.44 - 1.00 mg/dL 0.76 0.70  Sodium 135 - 145 mmol/L 140 138  Potassium 3.5 - 5.1 mmol/L 3.9  3.7  Chloride 98 - 111 mmol/L 109 109  CO2 22 - 32 mmol/L 24 23  Calcium 8.9 - 10.3 mg/dL 9.0 8.9  Total Protein 6.5 - 8.1 g/dL 6.8 6.2(L)  Total Bilirubin 0.3 - 1.2 mg/dL 0.9 1.5(H)  Alkaline Phos 38 - 126 U/L 135(H) 128(H)  AST 15 - 41 U/L 33 61(H)  ALT 0 - 44 U/L 7 61(H)      RADIOGRAPHIC STUDIES: I have personally reviewed the radiological images as listed and agreed with the findings in the report. No results found.   ASSESSMENT & PLAN:  JAQUELYNN WANAMAKER is a 69 y.o. female with   1.  Multifocal hepatocellular carcinoma -She was found to have multifocal [place] in segment 4A and 6 in 2016 when she began [facility] screening secondary to HCV cirrhosis, AFP 4640 at diagnosis -She underwent TACE at [facility] in 07/2015 with excellent partial response, and again to segment 4A and new [place] lesion in segment 8 and 03/2018.  -She was lost to follow-up after that due to progressive Parkinson's disease and deconditioning, limited mobility and transportation difficulties -New baseline 05/10/20 AFP markedly elevated 7206 which is concerning for disease progression however she is completely asymptomatic, no pain,  jaundice, ascites.  -I personally reviewed her CT AP from 06/05/20 and discussed with pt and her son, which showed residual liver cancer in b/l lobes with mild disease progression. No other indication of malignancy elsewhere. Scan also shows Large round mass within the pelvis adjacent to the uterus which is stable and likely benign  -I discussed treatment options of local therapy such as Y90 which is not curable but treatable to control her disease. Given she is not symptomatic from slow growing [place] and more symptomatic of her Parkinson's disease and other medical comorbilities, very limited PS, I recommend observation for now. Pt and her son agreed.  -F/u in 3 months. Will monitor with next scan in 6 months or sooner if symptomatic.    2. H/o HCV cirrhosis -Diagnosed in 2016, s/p 12 weeks of Epclusa per GI at [facility] -She does not see a liver specialist or GI yet in [place], her son will let us know if she wants a referral  3. Parkinson's disease -Progressed, s/p DBS -Has not seen neurology recently, managed by PCP -The patient's son, Carrie Ramsey, is very involved with her care, she lives with him, and they also have home health assistance daily to help with ADLs -She has been more intermittently confused lately.    PLAN: -CT scan reviewed -Lab and F/u in 3 months    No problem-specific Assessment & Plan notes found for this encounter.   No orders of the defined types were placed in this encounter.  I discussed the assessment and treatment plan with the patient. The patient was provided an opportunity to ask questions and all were answered. The patient agreed with the plan and demonstrated an understanding of the instructions.  The patient was advised to call back or seek an in-person evaluation if the symptoms worsen or if the condition fails to improve as anticipated.  The total time spent in the appointment was 22 minutes.    Truitt Merle, MD 06/09/2020   I, Joslyn Devon, am acting as  scribe for Truitt Merle, MD.   I have reviewed the above documentation for accuracy and completeness, and I agree with the above.

## 2020-06-09 ENCOUNTER — Inpatient Hospital Stay: Payer: Medicare Other | Attending: Nurse Practitioner | Admitting: Hematology

## 2020-06-09 ENCOUNTER — Encounter: Payer: Self-pay | Admitting: Hematology

## 2020-06-09 DIAGNOSIS — C220 Liver cell carcinoma: Secondary | ICD-10-CM | POA: Diagnosis not present

## 2020-06-12 ENCOUNTER — Telehealth: Payer: Self-pay | Admitting: Hematology

## 2020-06-12 NOTE — Telephone Encounter (Signed)
Scheduled appts per 12/10 los. Left voicemail with appt date and time.

## 2020-09-06 NOTE — Progress Notes (Incomplete)
Carrie Ramsey   Telephone:(336) [phone] Fax:(336) [phone]   Clinic Follow up Note   Patient Care Team: Rogers Blocker, MD as PCP - General (Internal Medicine) Jonnie Finner, RN as Oncology Nurse Navigator Truitt Merle, MD as Consulting Physician (Oncology) Alla Feeling, NP as Nurse Practitioner (Nurse Practitioner)  Date of Service:  09/06/2020  CHIEF COMPLAINT: F/u of hepatocellular carcinoma  SUMMARY OF ONCOLOGIC HISTORY: Oncology History  Hepatocellular carcinoma ([place])  06/2015 Imaging   2 lesions on MRI suspicious for [place], one measuring 3.6 cm in segment 4A and 3.2 cm in segment 6.   06/2015 Tumor Marker   AFP 4640   07/2015 Procedure   S/p TACE to [facility] lesions in seg 4A and 6   08/2015 Tumor Marker   AFP 55   03/2017 Imaging   persistent appearance of viable tumor in the lesion in segment 4A which mildly progressed in 03/2017 and with new evidence of LR-4 lesion in segment 8  AFP increased to 70  Continued surveillance    03/19/2018 Procedure   02/20/2018 imaging showed further progression and underwent TACE on 03/19/2018 of segment 4A lesion and segment 8 lesion   05/10/2020 Tumor Marker   AFP at 7206   05/15/2020 Initial Diagnosis   Hepatocellular carcinoma ([place])   06/05/2020 Imaging   CT AP  IMPRESSION: 1. Cirrhotic liver with evidence of portal hypertension. 2. Several arterial phase enhancing lesions in liver and multiple lesions which demonstrate washout. Findings are highly concerning for multifocal hepatocellular carcinoma 3. Large round mass within the pelvis adjacent to the uterus. 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 Consider MRI of the pelvis further characterization as warranted. Lesion increased mildly in size from 2020 favored benign process.        CURRENT THERAPY:  Observation   INTERVAL HISTORY: *** Carrie Ramsey is  here for a follow up. She presents to the clinic alone.    REVIEW OF SYSTEMS:  *** Constitutional: Denies fevers, chills or abnormal weight loss Eyes: Denies blurriness of vision Ears, nose, mouth, throat, and face: Denies mucositis or sore throat Respiratory: Denies cough, dyspnea or wheezes Cardiovascular: Denies palpitation, chest discomfort or lower extremity swelling Gastrointestinal:  Denies nausea, heartburn or change in bowel habits Skin: Denies abnormal skin rashes Lymphatics: Denies new lymphadenopathy or easy bruising Neurological:Denies numbness, tingling or new weaknesses Behavioral/Psych: Mood is stable, no new changes  All other systems were reviewed with the patient and are negative.  MEDICAL HISTORY:  Past Medical History:  Diagnosis Date  . Anxiety   . Arthritis of left hand   . Hay fever   . Hypertension   . TIA (transient ischemic attack) 2012   Patient reported, did not seek physician help    SURGICAL HISTORY: Past Surgical History:  Procedure Laterality Date  . APPENDECTOMY  1973    I have reviewed the social history and family history with the patient and they are unchanged from previous note.  ALLERGIES:  is allergic to penicillins and sulfa antibiotics.  MEDICATIONS:  Current Outpatient Medications  Medication Sig Dispense Refill  . acetaminophen (TYLENOL) 500 MG tablet Take 500 mg by mouth every 6 (six) hours as needed for headache (do not exceed more than 3000mg  in 24 hours).    . Ascorbic Acid (VITAMIN C) 500 MG CAPS Take 500 mg by mouth daily. 60 capsule   . calcium carbonate (TUMS -  DOSED IN MG ELEMENTAL CALCIUM) 500 MG chewable tablet Chew 1 tablet by mouth 3 (three) times daily as needed for indigestion or heartburn.    . Carbidopa-Levodopa ER (SINEMET CR) 25-100 MG tablet controlled release Take 1 tablet by mouth 4 (four) times daily.   0  . carboxymethylcellulose (REFRESH PLUS) 0.5 % SOLN Place 1 drop into both eyes 4 (four) times daily.  Wait 3-5 minutes before administering another eye drop    . lactulose (CHRONULAC) 10 GM/15ML solution Take 50 g by mouth 4 (four) times daily.    Marland Kitchen levETIRAcetam (KEPPRA) 500 MG tablet Take 500 mg by mouth 2 (two) times daily.    Marland Kitchen LORazepam (ATIVAN) 0.5 MG tablet Take 0.25 mg by mouth every 12 (twelve) hours as needed for anxiety.    . magnesium oxide (MAG-OX) 400 MG tablet Take 400 mg by mouth 2 (two) times daily.    . Melatonin 3 MG TABS Take 3 mg by mouth at bedtime.    . methocarbamol (ROBAXIN) 500 MG tablet Take 1 tablet (500 mg total) by mouth 2 (two) times daily. (Patient not taking: Reported on 07/21/2018) 20 tablet 0  . metoprolol succinate (TOPROL-XL) 25 MG 24 hr tablet Take 25 mg by mouth daily.    . ondansetron (ZOFRAN) 4 MG tablet Take 4 mg by mouth every 4 (four) hours as needed for nausea.    . rifaximin (XIFAXAN) 550 MG TABS tablet Take 550 mg by mouth 2 (two) times daily.    . sodium chloride (OCEAN) 0.65 % SOLN nasal spray Place 1 spray into both nostrils every 4 (four) hours as needed for congestion.     No current facility-administered medications for this visit.    PHYSICAL EXAMINATION: ECOG PERFORMANCE STATUS: {CHL ONC ECOG PS:[id number]}  There were no vitals filed for this visit. There were no vitals filed for this visit. *** GENERAL:alert, no distress and comfortable SKIN: skin color, texture, turgor are normal, no rashes or significant lesions EYES: normal, Conjunctiva are pink and non-injected, sclera clear {OROPHARYNX:no exudate, no erythema and lips, buccal mucosa, and tongue normal}  NECK: supple, thyroid normal size, non-tender, without nodularity LYMPH:  no palpable lymphadenopathy in the cervical, axillary {or inguinal} LUNGS: clear to auscultation and percussion with normal breathing effort HEART: regular rate & rhythm and no murmurs and no lower extremity edema ABDOMEN:abdomen soft, non-tender and normal bowel sounds Musculoskeletal:no cyanosis of  digits and no clubbing  NEURO: alert & oriented x 3 with fluent speech, no focal motor/sensory deficits  LABORATORY DATA:  I have reviewed the data as listed CBC Latest Ref Rng & Units 05/10/2020 07/21/2018 08/26/2011  WBC 4.0 - 10.5 K/uL 6.1 6.4 4.8  Hemoglobin 12.0 - 15.0 g/dL 15.4(H) 14.6 14.9  Hematocrit 36.0 - 46.0 % 45.5 44.0 45.0  Platelets 150 - 400 K/uL 145(L) 124(L) 99(L)     CMP Latest Ref Rng & Units 05/10/2020 07/21/2018  Glucose 70 - 99 mg/dL 85 102(H)  BUN 8 - 23 mg/dL 16 10  Creatinine 0.44 - 1.00 mg/dL 0.76 0.70  Sodium 135 - 145 mmol/L 140 138  Potassium 3.5 - 5.1 mmol/L 3.9 3.7  Chloride 98 - 111 mmol/L 109 109  CO2 22 - 32 mmol/L 24 23  Calcium 8.9 - 10.3 mg/dL 9.0 8.9  Total Protein 6.5 - 8.1 g/dL 6.8 6.2(L)  Total Bilirubin 0.3 - 1.2 mg/dL 0.9 1.5(H)  Alkaline Phos 38 - 126 U/L 135(H) 128(H)  AST 15 - 41 U/L 33 61(H)  ALT  0 - 44 U/L 7 61(H)      RADIOGRAPHIC STUDIES: I have personally reviewed the radiological images as listed and agreed with the findings in the report. No results found.   ASSESSMENT & PLAN:  PAITON BOULTINGHOUSE is a 70 y.o. female with   1.Multifocal hepatocellular carcinoma -She was found to have multifocal [place] in segment 4A and 6 in 2016 when she began [facility] screening secondary [place] cirrhosis, AFP 4640 at diagnosis -She underwent TACE at [facility] in 07/2015 with excellent partial response, and again to segment 4A and new [place] lesion in segment 8 and 03/2018.  -She was lost to follow-up after that due to progressive Parkinson's disease and deconditioning, limited mobility and transportation difficulties -New baseline 05/10/20 AFP markedly elevated7206which is concerning for disease progression however she is completely asymptomatic, no pain, jaundice, ascites.  -I personally reviewed her CT AP from 06/05/20 and discussed with pt and her son, which showed residual liver cancer in b/l lobes with mild disease progression. No other indication of  malignancy elsewhere. Scan also shows Large round mass within the pelvis adjacent to the uterus which is stable and likely benign  -I discussed treatment options of local therapy such as Y90 which is not curable but treatable to control her disease. Given she is not symptomatic from slow growing [place] and more symptomatic of her Parkinson's disease and other medical comorbilities, very limited PS, I recommend observation for now. Pt and her son agreed.  -F/u in 3 months. Will monitor with next scan in 6 months or sooner if symptomatic.    2.H/oHCV cirrhosis -Diagnosed in 2016, s/p 12 weeks of Epclusa per GI at [facility] -She does not see a liver specialist or GI yet in [place], her son will let us know if she wants a referral   3.Parkinson's disease -Progressed, s/p DBS -Has not seen neurology recently, managed by PCP -The patient's son, Nick,is very involved with her care, she lives with him, and they also have home health assistance daily to help with ADLs -She has been more intermittently confused lately.    PLAN: -CT scan reviewed -Lab and F/u in 3 months    No problem-specific Assessment & Plan notes found for this encounter.   No orders of the defined types were placed in this encounter.  All questions were answered. The patient knows to call the clinic with any problems, questions or concerns. No barriers to learning was detected. The total time spent in the appointment was {CHL ONC TIME VISIT - JKDTO:[id number]}.     Joslyn Devon 09/06/2020   Oneal Deputy, am acting as scribe for Truitt Merle, MD.   {Add scribe attestation statement}

## 2020-09-08 ENCOUNTER — Inpatient Hospital Stay: Payer: Medicare Other

## 2020-09-08 ENCOUNTER — Inpatient Hospital Stay: Payer: Medicare Other | Attending: Nurse Practitioner | Admitting: Hematology

## 2020-11-09 ENCOUNTER — Other Ambulatory Visit: Payer: Self-pay

## 2020-11-09 ENCOUNTER — Ambulatory Visit
Admission: RE | Admit: 2020-11-09 | Discharge: 2020-11-09 | Disposition: A | Discharge status: Home / Self Care | Payer: Medicare Other | Source: Ambulatory Visit | Attending: Internal Medicine | Admitting: Internal Medicine

## 2020-11-09 ENCOUNTER — Other Ambulatory Visit: Payer: Self-pay | Admitting: Internal Medicine

## 2020-11-09 DIAGNOSIS — W19XXXA Unspecified fall, initial encounter: Secondary | ICD-10-CM

## 2022-09-08 ENCOUNTER — Emergency Department (HOSPITAL_COMMUNITY): Payer: 59

## 2022-09-08 ENCOUNTER — Other Ambulatory Visit: Payer: Self-pay

## 2022-09-08 ENCOUNTER — Emergency Department (HOSPITAL_COMMUNITY)
Admission: EM | Admit: 2022-09-08 | Discharge: 2022-09-08 | Disposition: A | Discharge status: Home / Self Care | Payer: 59 | Attending: Emergency Medicine | Admitting: Emergency Medicine

## 2022-09-08 DIAGNOSIS — C220 Liver cell carcinoma: Secondary | ICD-10-CM | POA: Insufficient documentation

## 2022-09-08 DIAGNOSIS — R1011 Right upper quadrant pain: Secondary | ICD-10-CM | POA: Diagnosis present

## 2022-09-08 DIAGNOSIS — K8020 Calculus of gallbladder without cholecystitis without obstruction: Secondary | ICD-10-CM | POA: Diagnosis not present

## 2022-09-08 DIAGNOSIS — G20C Parkinsonism, unspecified: Secondary | ICD-10-CM | POA: Insufficient documentation

## 2022-09-08 LAB — COMPREHENSIVE METABOLIC PANEL
ALT: 38 U/L (ref 0–44)
AST: 45 U/L — ABNORMAL HIGH (ref 15–41)
Albumin: 3.6 g/dL (ref 3.5–5.0)
Alkaline Phosphatase: 132 U/L — ABNORMAL HIGH (ref 38–126)
Anion gap: 10 (ref 5–15)
BUN: 16 mg/dL (ref 8–23)
CO2: 23 mmol/L (ref 22–32)
Calcium: 9.3 mg/dL (ref 8.9–10.3)
Chloride: 106 mmol/L (ref 98–111)
Creatinine, Ser: 0.72 mg/dL (ref 0.44–1.00)
GFR, Estimated: >60 mL/min (ref >60)
Glucose, Bld: 108 mg/dL — ABNORMAL HIGH (ref 70–99)
Potassium: 3.7 mmol/L (ref 3.5–5.1)
Sodium: 139 mmol/L (ref 135–145)
Total Bilirubin: 1.5 mg/dL — ABNORMAL HIGH (ref 0.3–1.2)
Total Protein: 7.3 g/dL (ref 6.5–8.1)

## 2022-09-08 LAB — URINALYSIS, ROUTINE W REFLEX MICROSCOPIC
Bilirubin Urine: NEGATIVE
Glucose, UA: NEGATIVE mg/dL
Hgb urine dipstick: NEGATIVE
Ketones, ur: NEGATIVE mg/dL
Leukocytes,Ua: NEGATIVE
Nitrite: NEGATIVE
Protein, ur: NEGATIVE mg/dL
Specific Gravity, Urine: 1.02 (ref 1.005–1.030)
pH: 5 (ref 5.0–8.0)

## 2022-09-08 LAB — CBC WITH DIFFERENTIAL/PLATELET
Abs Immature Granulocytes: 0.02 10*3/uL (ref 0.00–0.07)
Basophils Absolute: 0.1 10*3/uL (ref 0.0–0.1)
Basophils Relative: 1 %
Eosinophils Absolute: 0.1 10*3/uL (ref 0.0–0.5)
Eosinophils Relative: 2 %
HCT: 45.4 % (ref 36.0–46.0)
Hemoglobin: 15 g/dL (ref 12.0–15.0)
Immature Granulocytes: 0 %
Lymphocytes Relative: 23 %
Lymphs Abs: 1.6 10*3/uL (ref 0.7–4.0)
MCH: 30.1 pg (ref 26.0–34.0)
MCHC: 33 g/dL (ref 30.0–36.0)
MCV: 91.2 fL (ref 80.0–100.0)
Monocytes Absolute: 0.6 10*3/uL (ref 0.1–1.0)
Monocytes Relative: 8 %
Neutro Abs: 4.7 10*3/uL (ref 1.7–7.7)
Neutrophils Relative %: 66 %
Platelets: 147 10*3/uL — ABNORMAL LOW (ref 150–400)
RBC: 4.98 MIL/uL (ref 3.87–5.11)
RDW: 13.9 % (ref 11.5–15.5)
WBC: 7 10*3/uL (ref 4.0–10.5)
nRBC: 0 % (ref 0.0–0.2)

## 2022-09-08 LAB — AMMONIA: Ammonia: 14 umol/L (ref 9–35)

## 2022-09-08 LAB — TROPONIN I (HIGH SENSITIVITY)
Troponin I (High Sensitivity): 5 ng/L (ref <18)
Troponin I (High Sensitivity): 7 ng/L (ref <18)

## 2022-09-08 LAB — LIPASE, BLOOD: Lipase: 33 U/L (ref 11–51)

## 2022-09-08 MED ORDER — SODIUM CHLORIDE 0.9 % IV BOLUS
1000.0000 mL | Freq: Once | INTRAVENOUS | Status: AC
  Administered 2022-09-08: 1000 mL via INTRAVENOUS

## 2022-09-08 MED ORDER — OXYCODONE HCL 5 MG PO TABS: 5.0000 mg | ORAL_TABLET | ORAL | 0 refills | Status: AC | PRN

## 2022-09-08 MED ORDER — ONDANSETRON HCL 4 MG/2ML IJ SOLN
4.0000 mg | Freq: Once | INTRAMUSCULAR | Status: AC
  Administered 2022-09-08: 4 mg via INTRAVENOUS
  Filled 2022-09-08: qty 2

## 2022-09-08 MED ORDER — FENTANYL CITRATE PF 50 MCG/ML IJ SOSY
50.0000 ug | PREFILLED_SYRINGE | Freq: Once | INTRAMUSCULAR | Status: AC
  Administered 2022-09-08: 50 ug via INTRAVENOUS
  Filled 2022-09-08: qty 1

## 2022-09-08 MED ORDER — IOHEXOL 300 MG/ML  SOLN
100.0000 mL | Freq: Once | INTRAMUSCULAR | Status: AC | PRN
  Administered 2022-09-08: 100 mL via INTRAVENOUS

## 2022-09-08 NOTE — Discharge Instructions (Addendum)
You were seen in the emergency department for right upper quadrant abdominal pain.  You had lab work CAT scan and ultrasound.  Your liver cancer lesions appear larger and this may be a cause of your pain.  You also had some gallstones in your gallbladder although they did not look inflamed at this time.  We are trying you on some pain medication.  Please be aware that this may make you constipated dizzy nauseous.  Drink plenty of fluids.  Call Dr. Burr Medico tomorrow for close follow-up.  Return to the emergency department if any worsening or concerning symptoms.

## 2022-09-08 NOTE — ED Notes (Signed)
Per OB:[id number] lab- recollect needed for Ammonia order (dark green on ice). RN advised. [employer]

## 2022-09-08 NOTE — ED Provider Notes (Signed)
[place] EMERGENCY DEPARTMENT AT [facility] Provider Note   CSN: [id number] Arrival date & time: 09/08/22  1309     History {Add pertinent medical, surgical, social history, OB history to HPI:1} Chief Complaint  Patient presents with   Abdominal Pain    Carrie Ramsey is a 72 y.o. female.  Level 5 caveat secondary to somnolence.  She has a history of hep C, hepatocellular carcinoma, Parkinson's disease.  Son is primarily giving most of the history.  Sound like over the last 3 weeks she has been having more Parkinson's symptoms with decreased speech and activity.  She began having some right-sided abdominal pain last evening and continued today and so he brought her in for evaluation.  He said she ate breakfast today.  There is not been a fever.  She does endorse a little bit of a cough.  There does not appear to have been any diarrhea or constipation no reported urinary symptoms.  He said she still been ambulatory.  The history is provided by the patient.  Abdominal Pain Pain location:  RUQ Pain severity:  Severe Onset quality:  Gradual Duration:  2 days Timing:  Unable to specify Progression:  Unchanged Chronicity:  New Context: not trauma   Relieved by:  None tried Worsened by:  Nothing Ineffective treatments:  None tried Associated symptoms: cough   Associated symptoms: no chest pain, no diarrhea, no dysuria, no fever, no hematemesis, no hematochezia, no hematuria, no shortness of breath and no vomiting        Home Medications Prior to Admission medications   Medication Sig Start Date End Date Taking? Authorizing Provider  acetaminophen (TYLENOL) 500 MG tablet Take 500 mg by mouth every 6 (six) hours as needed for headache (do not exceed more than '3000mg'$  in 24 hours).    [provider]  Ascorbic Acid (VITAMIN C) 500 MG CAPS Take 500 mg by mouth daily. 08/27/11   Angelica Ran, MD  calcium carbonate (TUMS - DOSED IN MG ELEMENTAL CALCIUM) 500 MG  chewable tablet Chew 1 tablet by mouth 3 (three) times daily as needed for indigestion or heartburn.    [provider]  Carbidopa-Levodopa ER (SINEMET CR) 25-100 MG tablet controlled release Take 1 tablet by mouth 4 (four) times daily.  10/03/14   [provider]  carboxymethylcellulose (REFRESH PLUS) 0.5 % SOLN Place 1 drop into both eyes 4 (four) times daily. Wait 3-5 minutes before administering another eye drop    [provider]  lactulose (CHRONULAC) 10 GM/15ML solution Take 50 g by mouth 4 (four) times daily.    [provider]  levETIRAcetam (KEPPRA) 500 MG tablet Take 500 mg by mouth 2 (two) times daily.    [provider]  LORazepam (ATIVAN) 0.5 MG tablet Take 0.25 mg by mouth every 12 (twelve) hours as needed for anxiety.    [provider]  magnesium oxide (MAG-OX) 400 MG tablet Take 400 mg by mouth 2 (two) times daily.    [provider]  Melatonin 3 MG TABS Take 3 mg by mouth at bedtime.    [provider]  methocarbamol (ROBAXIN) 500 MG tablet Take 1 tablet (500 mg total) by mouth 2 (two) times daily. Patient not taking: Reported on 07/21/2018 10/28/15   Recardo Evangelist, PA-C  metoprolol succinate (TOPROL-XL) 25 MG 24 hr tablet Take 25 mg by mouth daily.    [provider]  ondansetron (ZOFRAN) 4 MG tablet Take 4 mg by  mouth every 4 (four) hours as needed for nausea.    [provider]  rifaximin (XIFAXAN) 550 MG TABS tablet Take 550 mg by mouth 2 (two) times daily.    [provider]  sodium chloride (OCEAN) 0.65 % SOLN nasal spray Place 1 spray into both nostrils every 4 (four) hours as needed for congestion.    [provider]      Allergies    Penicillins and Sulfa antibiotics    Review of Systems   Review of Systems  Unable to perform ROS: Other  Constitutional:  Negative for fever.  Respiratory:  Positive for cough. Negative for shortness of breath.    Cardiovascular:  Negative for chest pain.  Gastrointestinal:  Positive for abdominal pain. Negative for diarrhea, hematemesis, hematochezia and vomiting.  Genitourinary:  Negative for dysuria and hematuria.    Physical Exam Updated Vital Signs BP (!) 164/102 (BP Location: Right Arm)   Pulse 93   Temp 99.2 F (37.3 C) (Oral)   Resp 16   Ht '5\' 6"'$  (1.676 m)   Wt 64 kg   SpO2 100%   BMI 22.77 kg/m  Physical Exam Vitals and nursing note reviewed.  Constitutional:      General: She is not in acute distress.    Appearance: She is underweight.  HENT:     Head: Normocephalic and atraumatic.  Eyes:     Conjunctiva/sclera: Conjunctivae normal.  Cardiovascular:     Rate and Rhythm: Normal rate and regular rhythm.     Heart sounds: No murmur heard. Pulmonary:     Effort: Pulmonary effort is normal. No respiratory distress.     Breath sounds: Normal breath sounds.  Abdominal:     Palpations: Abdomen is soft.     Tenderness: There is no abdominal tenderness. There is no guarding or rebound.  Musculoskeletal:        General: No deformity. Normal range of motion.     Cervical back: Neck supple.  Skin:    General: Skin is warm and dry.     Capillary Refill: Capillary refill takes less than 2 seconds.  Neurological:     Comments: She is awake and will sometimes respond to questioning.  She is very slow in her movement and not really participating much in neurologic exam.  Does not appear to have any gross focal deficits.     ED Results / Procedures / Treatments   Labs (all labs ordered are listed, but only abnormal results are displayed) Labs Reviewed - No data to display  EKG None  Radiology No results found.  Procedures Procedures  {Document cardiac monitor, telemetry assessment procedure when appropriate:1}  Medications Ordered in ED Medications  sodium chloride 0.9 % bolus 1,000 mL (has no administration in time range)  ondansetron (ZOFRAN) injection 4 mg (has no  administration in time range)  fentaNYL (SUBLIMAZE) injection 50 mcg (has no administration in time range)    ED Course/ Medical Decision Making/ A&P   {   Click here for ABCD2, HEART and other calculatorsREFRESH Note before signing :1}                          Medical Decision Making Amount and/or Complexity of Data Reviewed Labs: ordered. Radiology: ordered.  Risk Prescription drug management.   This patient complains of ***; this involves an extensive number of treatment Options and is a complaint that carries with it a high risk of complications and morbidity.  The differential includes ***  I ordered, reviewed and interpreted labs, which included *** I ordered medication *** and reviewed PMP when indicated. I ordered imaging studies which included *** and I independently    visualized and interpreted imaging which showed *** Additional history obtained from *** Previous records obtained and reviewed *** I consulted *** and discussed lab and imaging findings and discussed disposition.  Cardiac monitoring reviewed, *** Social determinants considered, *** Critical Interventions: ***  After the interventions stated above, I reevaluated the patient and found *** Admission and further testing considered, ***   {Document critical care time when appropriate:1} {Document review of labs and clinical decision tools ie heart score, Chads2Vasc2 etc:1}  {Document your independent review of radiology images, and any outside records:1} {Document your discussion with family members, caretakers, and with consultants:1} {Document social determinants of health affecting pt's care:1} {Document your decision making why or why not admission, treatments were needed:1} Final Clinical Impression(s) / ED Diagnoses Final diagnoses:  None    Rx / DC Orders ED Discharge Orders     None

## 2022-09-08 NOTE — ED Triage Notes (Addendum)
Pt reports right abdominal pain since last night. Denies n/v/d Hx liver cancer

## 2022-09-09 ENCOUNTER — Telehealth: Payer: Self-pay | Admitting: Internal Medicine

## 2022-09-09 NOTE — Telephone Encounter (Signed)
Patients son called to make a follow up with Dr.Feng, patient was recently in hospital and they felt she should follow back up/ Scheduled patient.

## 2022-09-10 ENCOUNTER — Telehealth: Payer: Self-pay | Admitting: Hematology

## 2022-09-10 NOTE — Telephone Encounter (Signed)
Contacted patient to scheduled appointments. Left message with appointment details and a call back number if patient had any questions or could not accommodate the time we provided.   

## 2022-09-13 ENCOUNTER — Encounter: Payer: Self-pay | Admitting: Hematology

## 2022-09-13 ENCOUNTER — Inpatient Hospital Stay: Payer: 59 | Attending: Hematology | Admitting: Hematology

## 2022-09-13 ENCOUNTER — Other Ambulatory Visit: Payer: Self-pay

## 2022-09-13 VITALS — BP 142/89 | HR 73 | Temp 98.4°F | Resp 14 | Ht 66.0 in | Wt 135.1 lb

## 2022-09-13 DIAGNOSIS — I10 Essential (primary) hypertension: Secondary | ICD-10-CM | POA: Insufficient documentation

## 2022-09-13 DIAGNOSIS — C220 Liver cell carcinoma: Secondary | ICD-10-CM | POA: Diagnosis not present

## 2022-09-13 DIAGNOSIS — G20A1 Parkinson's disease without dyskinesia, without mention of fluctuations: Secondary | ICD-10-CM | POA: Insufficient documentation

## 2022-09-13 NOTE — Progress Notes (Signed)
[facility]   Telephone:([phone] [phone]   Clinic Follow up Note   Patient Care Team: Rogers Blocker, MD as PCP - General (Internal Medicine) Truitt Merle, MD as Consulting Physician (Oncology) Alla Feeling, NP as Nurse Practitioner (Nurse Practitioner)  Date of Service:  09/13/2022  CHIEF COMPLAINT: f/u of  hepatocellular carcinoma   CURRENT THERAPY:  Observation  ASSESSMENT:  Carrie Ramsey is a 72 y.o. female with   Hepatocellular carcinoma ([place]) Multifocal hepatocellular carcinoma -She was found to have multifocal [place] in segment 4A and 6 in 2016 when she began [facility] screening secondary to [place] cirrhosis, AFP 4640 at diagnosis -She underwent TACE at [facility] in 07/2015 with excellent partial response, and again to segment 4A and new [place] lesion in segment 8 and 03/2018.  -She was lost to follow-up after that due to progressive Parkinson's disease and deconditioning, limited mobility and transportation difficulties -New baseline 05/10/20 AFP markedly elevated 7206 which is concerning for disease progression however she is completely asymptomatic, no pain, jaundice, ascites.  -I previously reviewed her CT AP from 06/05/20 and discussed with pt and her son, which showed residual liver cancer in b/l lobes with mild disease progression. No other indication of malignancy elsewhere. Scan also shows Large round mass within the pelvis adjacent to the uterus which is stable and likely benign  -I previously discussed treatment options of local therapy such as Y90 which is not curable but treatable to control her disease. Given she is not symptomatic from slow growing [place] and more symptomatic of her Parkinson's disease and other medical comorbilities, very limited PS, I recommend observation for now. Pt and her son agreed.  -she lost f/u after last visit in 05/2020 -she was evaluated in ED on 09/08/2022, and CT showed worsening liver cancer  -We discussed option of liver targeted  therapy, such as Y90, versus continue observation, since she has not had recurrent abdominal pain.  Due to her significant comorbidities and limited mobility, I think it is also reasonable to wait and see.  We also discussed that that she has developed a mild hyperbilirubinemia, if her liver function gets worse, especially bilirubin more than 2, she may not be a candidate for liver targeted therapy.  Her son voiced good understanding and is agreeable with the plan of watchful wait, but open to have a conversation with IR to learn more about Y90 treatment. Will refer her    PLAN: -discuss Y90 -Lab reviewed -referral to interventional radiology -f/u in 3 months for follow-up  SUMMARY OF ONCOLOGIC HISTORY: Oncology History Overview Note   Cancer Staging  Hepatocellular carcinoma ([place]) Staging form: Liver, AJCC 8th Edition - Clinical stage from 06/01/2015: Stage IB (cT1b, cN0, cM0) - Signed by Truitt Merle, MD on 09/08/2020     Hepatocellular carcinoma ([place])  06/2015 Imaging   2 lesions on MRI suspicious for [place], one measuring 3.6 cm in segment 4A and 3.2 cm in segment 6.   06/2015 Tumor Marker   AFP 4640   06/01/2015 Cancer Staging   Staging form: Liver, AJCC 8th Edition - Clinical stage from 06/01/2015: Stage IB (cT1b, cN0, cM0) - Signed by Truitt Merle, MD on 09/08/2020   07/2015 Procedure   S/p TACE to [facility] lesions in seg 4A and 6   08/2015 Tumor Marker   AFP 55   03/2017 Imaging   persistent appearance of viable tumor in the lesion in segment 4A which mildly progressed in 03/2017 and with new evidence  of LR-4 lesion in segment 8  AFP increased to 70  Continued surveillance    03/19/2018 Procedure   02/20/2018 imaging showed further progression and underwent TACE on 03/19/2018 of segment 4A lesion and segment 8 lesion   05/10/2020 Tumor Marker   AFP at 7206   05/15/2020 Initial Diagnosis   Hepatocellular carcinoma ([place])   06/05/2020 Imaging   CT AP  IMPRESSION: 1. Cirrhotic liver  with evidence of portal hypertension. 2. Several arterial phase enhancing lesions in liver and multiple lesions which demonstrate washout. Findings are highly concerning for multifocal hepatocellular carcinoma 3. Large round mass within the pelvis adjacent to the uterus. 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 Consider MRI of the pelvis further characterization as warranted. Lesion increased mildly in size from 2020 favored benign process.        INTERVAL HISTORY:  Carrie Ramsey is here for a follow up of  hepatocellular carcinoma  She was last seen by me on 06/09/2020 She presents to the clinic accompanied by son. Pt went to the ED on Sunday for stomach pains. Pt son stated the pt complain of stomach pains since the visit to the ED and he gave her hydrocodone. Pt denies having pain and constipation and diarrhea. Pt spend most time in her bedroom pt is able to get out the bed and be mobile.    All other systems were reviewed with the patient and are negative.  MEDICAL HISTORY:  Past Medical History:  Diagnosis Date   Anxiety    Arthritis of left hand    Hay fever    Hypertension    TIA (transient ischemic attack) 2012   Patient reported, did not seek physician help    SURGICAL HISTORY: Past Surgical History:  Procedure Laterality Date   APPENDECTOMY  1973    I have reviewed the social history and family history with the patient and they are unchanged from previous note.  ALLERGIES:  is allergic to penicillins and sulfa antibiotics.  MEDICATIONS:  Current Outpatient Medications  Medication Sig Dispense Refill   acetaminophen (TYLENOL) 500 MG tablet Take 500 mg by mouth every 6 (six) hours as needed for headache (do not exceed more than 3000mg  in 24 hours).     Ascorbic Acid (VITAMIN C) 500 MG CAPS Take 500 mg by mouth daily. 60 capsule    calcium carbonate (TUMS - DOSED IN  MG ELEMENTAL CALCIUM) 500 MG chewable tablet Chew 1 tablet by mouth 3 (three) times daily as needed for indigestion or heartburn.     Carbidopa-Levodopa ER (SINEMET CR) 25-100 MG tablet controlled release Take 1 tablet by mouth 4 (four) times daily.   0   carboxymethylcellulose (REFRESH PLUS) 0.5 % SOLN Place 1 drop into both eyes 4 (four) times daily. Wait 3-5 minutes before administering another eye drop     lactulose (CHRONULAC) 10 GM/15ML solution Take 50 g by mouth 4 (four) times daily.     levETIRAcetam (KEPPRA) 500 MG tablet Take 500 mg by mouth 2 (two) times daily.     LORazepam (ATIVAN) 0.5 MG tablet Take 0.25 mg by mouth every 12 (twelve) hours as needed for anxiety.     magnesium oxide (MAG-OX) 400 MG tablet Take 400 mg by mouth 2 (two) times daily.     Melatonin 3 MG TABS Take 3 mg by mouth at bedtime.     methocarbamol (ROBAXIN) 500 MG tablet Take  1 tablet (500 mg total) by mouth 2 (two) times daily. (Patient not taking: Reported on 07/21/2018) 20 tablet 0   metoprolol succinate (TOPROL-XL) 25 MG 24 hr tablet Take 25 mg by mouth daily.     ondansetron (ZOFRAN) 4 MG tablet Take 4 mg by mouth every 4 (four) hours as needed for nausea.     oxyCODONE (OXY IR/ROXICODONE) 5 MG immediate release tablet Take 1 tablet (5 mg total) by mouth every 4 (four) hours as needed for severe pain. 12 tablet 0   rifaximin (XIFAXAN) 550 MG TABS tablet Take 550 mg by mouth 2 (two) times daily.     sodium chloride (OCEAN) 0.65 % SOLN nasal spray Place 1 spray into both nostrils every 4 (four) hours as needed for congestion.     No current facility-administered medications for this visit.    PHYSICAL EXAMINATION: ECOG PERFORMANCE STATUS: 3 - Symptomatic, >50% confined to bed  Vitals:   09/13/22 1430  BP: (!) 142/89  Pulse: 73  Resp: 14  Temp: 98.4 F (36.9 C)  SpO2: 97%   Wt Readings from Last 3 Encounters:  09/13/22 135 lb 1.6 oz (61.3 kg)  09/08/22 141 lb 1.5 oz (64 kg)  05/10/20 140 lb 1.6 oz  (63.5 kg)     ABDOMEN:(-) abdomen soft, (-) non-tender and normal bowel sounds  LABORATORY DATA:  I have reviewed the data as listed    Latest Ref Rng & Units 09/08/2022    2:12 PM 05/10/2020    2:48 PM 07/21/2018   11:04 AM  CBC  WBC 4.0 - 10.5 K/uL 7.0  6.1  6.4   Hemoglobin 12.0 - 15.0 g/dL 15.0  15.4  14.6   Hematocrit 36.0 - 46.0 % 45.4  45.5  44.0   Platelets 150 - 400 K/uL 147  145  124         Latest Ref Rng & Units 09/08/2022    2:12 PM 05/10/2020    2:48 PM 07/21/2018   11:03 AM  CMP  Glucose 70 - 99 mg/dL 108  85  102   BUN 8 - 23 mg/dL 16  16  10    Creatinine 0.44 - 1.00 mg/dL 0.72  0.76  0.70   Sodium 135 - 145 mmol/L 139  140  138   Potassium 3.5 - 5.1 mmol/L 3.7  3.9  3.7   Chloride 98 - 111 mmol/L 106  109  109   CO2 22 - 32 mmol/L 23  24  23    Calcium 8.9 - 10.3 mg/dL 9.3  9.0  8.9   Total Protein 6.5 - 8.1 g/dL 7.3  6.8  6.2   Total Bilirubin 0.3 - 1.2 mg/dL 1.5  0.9  1.5   Alkaline Phos 38 - 126 U/L 132  135  128   AST 15 - 41 U/L 45  33  61   ALT 0 - 44 U/L 38  7  61       RADIOGRAPHIC STUDIES: I have personally reviewed the radiological images as listed and agreed with the findings in the report. No results found.    Orders Placed This Encounter  Procedures   Ambulatory referral to Interventional Radiology    Referral Priority:   Routine    Referral Type:   Consultation    Referral Reason:   Specialty Services Required    Requested Specialty:   Interventional Radiology    Number of Visits Requested:   1   All questions were answered.  The patient knows to call the clinic with any problems, questions or concerns. No barriers to learning was detected. The total time spent in the appointment was 30 minutes.     Truitt Merle, MD 09/13/2022   Felicity Coyer, CMA, am acting as scribe for Truitt Merle, MD.   I have reviewed the above documentation for accuracy and completeness, and I agree with the above.

## 2022-09-13 NOTE — Assessment & Plan Note (Signed)
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  -She was lost to follow-up after that due to progressive Parkinson's disease and deconditioning, limited mobility and transportation difficulties -New baseline 05/10/20 AFP markedly elevated 7206 which is concerning for disease progression however she is completely asymptomatic, no pain, jaundice, ascites.  -I previously reviewed her CT AP from 06/05/20 and discussed with pt and her son, which showed residual liver cancer in b/l lobes with mild disease progression. No other indication of malignancy elsewhere. Scan also shows Large round mass within the pelvis adjacent to the uterus which is stable and likely benign  -I previously discussed treatment options of local therapy such as Y90 which is not curable but treatable to control her disease. Given she is not symptomatic from slow growing [place] and more symptomatic of her Parkinson's disease and other medical comorbilities, very limited PS, I recommend observation for now. Pt and her son agreed.  -she lost f/u after last visit in 05/2020 -she was evaluated in ED on 09/08/2022, and CT showed worsening liver cancer

## 2022-09-17 ENCOUNTER — Inpatient Hospital Stay: Payer: 59 | Admitting: Hematology

## 2022-09-26 ENCOUNTER — Other Ambulatory Visit: Payer: Self-pay

## 2022-10-30 DEATH — deceased

## 2022-11-27 ENCOUNTER — Telehealth: Payer: Self-pay | Admitting: Hematology

## 2022-11-27 NOTE — Telephone Encounter (Signed)
Left a voicemail regarding patients appointment being rescheduled.

## 2022-12-27 ENCOUNTER — Ambulatory Visit: Payer: 59 | Admitting: Hematology

## 2022-12-27 ENCOUNTER — Other Ambulatory Visit: Payer: 59

## 2023-01-10 ENCOUNTER — Other Ambulatory Visit: Payer: Self-pay

## 2023-01-10 DIAGNOSIS — C220 Liver cell carcinoma: Secondary | ICD-10-CM

## 2023-01-12 NOTE — Assessment & Plan Note (Deleted)
Multifocal hepatocellular carcinoma -She was found to have multifocal HCC in segment 4A and 6 in 2016 when she began [facility] screening secondary to HCV cirrhosis, AFP 4640 at diagnosis -She underwent TACE at [facility] in 07/2015 with excellent partial response, and again to segment 4A and new HCC lesion in segment 8 and 03/2018.  -She was lost to follow-up after that due to progressive Parkinson's disease and deconditioning, limited mobility and transportation difficulties -New baseline 05/10/20 AFP markedly elevated 7206 which is concerning for disease progression however she is completely asymptomatic, no pain, jaundice, ascites.  -I previously reviewed her CT AP from 06/05/20 and discussed with pt and her son, which showed residual liver cancer in b/l lobes with mild disease progression. No other indication of malignancy elsewhere. Scan also shows Large round mass within the pelvis adjacent to the uterus which is stable and likely benign  -I previously discussed treatment options of local therapy such as Y90 which is not curable but treatable to control her disease. Given she is not symptomatic from slow growing HCC and more symptomatic of her Parkinson's disease and other medical comorbilities, very limited PS, I recommend observation for now. Pt and her son agreed.  -she lost f/u after last visit in 05/2020 -she was evaluated in ED on 09/08/2022, and CT showed worsening liver cancer  -We discussed option of liver targeted therapy, such as Y90, versus continue observation, since she has not had recurrent abdominal pain.  Due to her significant comorbidities and limited mobility, I think it is also reasonable to wait and see. I referred her to IR in 08/2022 but she has not been seen

## 2023-01-13 ENCOUNTER — Inpatient Hospital Stay: Payer: Self-pay | Attending: Hematology

## 2023-01-13 ENCOUNTER — Inpatient Hospital Stay: Payer: Self-pay | Admitting: Hematology

## 2023-01-13 DIAGNOSIS — C220 Liver cell carcinoma: Secondary | ICD-10-CM
